# Patient Record
Sex: Male | Born: 1970 | Race: Black or African American | Hispanic: No | Marital: Married | Smoking: Never smoker
Health system: Southern US, Community
[De-identification: ages and names within clinical notes are randomized; demographics above are authoritative.]

## PROBLEM LIST (undated history)

## (undated) DIAGNOSIS — I209 Angina pectoris, unspecified: Secondary | ICD-10-CM

## (undated) DIAGNOSIS — K219 Gastro-esophageal reflux disease without esophagitis: Secondary | ICD-10-CM

## (undated) DIAGNOSIS — Z8739 Personal history of other diseases of the musculoskeletal system and connective tissue: Secondary | ICD-10-CM

## (undated) DIAGNOSIS — I10 Essential (primary) hypertension: Secondary | ICD-10-CM

## (undated) DIAGNOSIS — B359 Dermatophytosis, unspecified: Secondary | ICD-10-CM

## (undated) DIAGNOSIS — M79603 Pain in arm, unspecified: Secondary | ICD-10-CM

## (undated) DIAGNOSIS — J310 Chronic rhinitis: Secondary | ICD-10-CM

## (undated) DIAGNOSIS — M1990 Unspecified osteoarthritis, unspecified site: Secondary | ICD-10-CM

## (undated) HISTORY — PX: VASECTOMY: SHX75

## (undated) HISTORY — PX: WISDOM TOOTH EXTRACTION: SHX21

---

## 2012-06-26 ENCOUNTER — Emergency Department: Payer: Self-pay | Admitting: Emergency Medicine

## 2012-07-23 ENCOUNTER — Emergency Department: Payer: Self-pay | Admitting: Emergency Medicine

## 2013-11-28 ENCOUNTER — Ambulatory Visit: Payer: Self-pay | Admitting: Nurse Practitioner

## 2014-11-14 DIAGNOSIS — I209 Angina pectoris, unspecified: Secondary | ICD-10-CM

## 2014-11-14 HISTORY — DX: Angina pectoris, unspecified: I20.9

## 2016-05-21 ENCOUNTER — Ambulatory Visit
Admission: EM | Admit: 2016-05-21 | Discharge: 2016-05-21 | Disposition: A | Discharge status: Home / Self Care | Payer: BC Managed Care – PPO | Attending: Family Medicine | Admitting: Family Medicine

## 2016-05-21 ENCOUNTER — Ambulatory Visit (INDEPENDENT_AMBULATORY_CARE_PROVIDER_SITE_OTHER): Payer: BC Managed Care – PPO

## 2016-05-21 DIAGNOSIS — R202 Paresthesia of skin: Secondary | ICD-10-CM

## 2016-05-21 NOTE — ED Provider Notes (Signed)
CSN: [id number]     Arrival date & time 05/21/16  0841 History   First MD Initiated Contact with Patient 05/21/16 [id number]     Chief Complaint  Patient presents with  . Numbness   (Consider location/radiation/quality/duration/timing/severity/associated sxs/prior Treatment) HPI  This a 45 year old male who presents with pain and numbness indicating the distal fourth and fifth metatarsal into the toes. He has no known injury. He does have a history of degenerative disc disease in his lumbar spine and has had sciatica on numerous occasions. He has had no discomfort or difficulty with ambulation running or sitting. Does have a noxious feeling when his foot got rubs against bedsheets or other light touching. Had no recent change in shoe wear and has not noticed that shoes bother him. His distal lateral foot is "always numb and tingly" and has the noxious feeling with any light touch. He does not remember any compressive injury to his foot. Not noticed any weakness in the foot.      History reviewed. No pertinent past medical history. Past Surgical History  Procedure Laterality Date  . Vasectomy     Family History  Problem Relation Age of Onset  . Diabetes Mother   . Healthy Father    Social History  Substance Use Topics  . Smoking status: Never Smoker   . Smokeless tobacco: None  . Alcohol Use: 0.0 oz/week    0 Standard drinks or equivalent per week     Comment: occasionally    Review of Systems  Constitutional: Positive for activity change. Negative for fever, chills and fatigue.  Musculoskeletal: Negative for gait problem.  Neurological: Positive for numbness. Negative for weakness.  All other systems reviewed and are negative.   Allergies  Bee venom  Home Medications   Prior to Admission medications   Not on File   Meds Ordered and Administered this Visit  Medications - No data to display  BP 122/76 mmHg  Pulse 78  Temp(Src) 97.3 F (36.3 C) (Tympanic)  Resp 16  Ht  5\' 11"  (1.803 m)  Wt 217 lb (98.431 kg)  BMI 30.28 kg/m2  SpO2 99% No data found.   Physical Exam  Constitutional: He is oriented to person, place, and time. He appears well-developed and well-nourished.  HENT:  Head: Normocephalic and atraumatic.  Eyes: Conjunctivae are normal. Pupils are equal, round, and reactive to light.  Neck: Normal range of motion. Neck supple.  Musculoskeletal: Normal range of motion. He exhibits tenderness. He exhibits no edema.  Examination of the right foot and ankle shows good range of motion dorsiflexion plantar flexion eversion inversion and subtalar motion. There is no edema no ecchymosis no erythema no swelling. He was able esthesia to light touch along the lateral aspect of the foot along the fifth metatarsal that extends proximally to the metatarsal tarsal junction and medially to the fourth metatarsal. The medial aspect of the foot appears to be spared. Anterior tibialis EHL and peroneal muscles are all strong to testing.  Neurological: He is oriented to person, place, and time. He has normal reflexes. He exhibits normal muscle tone. Coordination normal.  Skin: Skin is warm and dry. No erythema.  Psychiatric: He has a normal mood and affect. His behavior is normal. Judgment and thought content normal.  Nursing note and vitals reviewed.   ED Course  Procedures (including critical care time)  Labs Review Labs Reviewed - No data to display  Imaging Review Dg Foot Complete Right  05/21/2016  CLINICAL DATA:  Lateral RIGHT foot numbness at 4th to 5th metatarsals for 2 weeks, no history of trauma or injury, history degenerative disc disease EXAM: RIGHT FOOT COMPLETE - 3+ VIEW COMPARISON:  None FINDINGS: Osseous mineralization normal. Joint spaces preserved. No acute fracture, dislocation or bone destruction. Soft tissues radiographically unremarkable. IMPRESSION: Normal exam. Electronically Signed   By: Ulyses Southward M.D.   On: 05/21/2016 10:19     Visual  Acuity Review  Right Eye Distance:   Left Eye Distance:   Bilateral Distance:    Right Eye Near:   Left Eye Near:    Bilateral Near:         MDM   1. Paresthesia of right foot    There are no discharge medications for this patient. Plan: 1. Test/x-ray results and diagnosis reviewed with patient 2. rx as per orders; risks, benefits, potential side effects reviewed with patient 3. Recommend supportive treatment with Some over-the-counter nonsteroidal anti-inflammatories on still seen by podiatrist. I asked him to be a detective to figure out what may be causing the compression of the nerve. I've explained neuropraxia to the patient advised him that the podiatrist may want to obtain an EMG studies. He will contact the podiatrist this week for a future appointment 4. F/u prn if symptoms worsen or don't improve     Lutricia Feil, PA-C 05/21/16 1035

## 2016-05-21 NOTE — Discharge Instructions (Signed)
Paresthesia Paresthesia is an abnormal burning or prickling sensation. This sensation is generally felt in the hands, arms, legs, or feet. However, it may occur in any part of the body. Usually, it is not painful. The feeling may be described as:  Tingling or numbness.  Pins and needles.  Skin crawling.  Buzzing.  Limbs falling asleep.  Itching. Most people experience temporary (transient) paresthesia at some time in their lives. Paresthesia may occur when you breathe too quickly (hyperventilation). It can also occur without any apparent cause. Commonly, paresthesia occurs when pressure is placed on a nerve. The sensation quickly goes away after the pressure is removed. For some people, however, paresthesia is a long-lasting (chronic) condition that is caused by an underlying disorder. If you continue to have paresthesia, you may need further medical evaluation. HOME CARE INSTRUCTIONS Watch your condition for any changes. Taking the following actions may help to lessen any discomfort that you are feeling:  Avoid drinking alcohol.  Try acupuncture or massage to help relieve your symptoms.  Keep all follow-up visits as directed by your health care provider. This is important. SEEK MEDICAL CARE IF:  You continue to have episodes of paresthesia.  Your burning or prickling feeling gets worse when you walk.  You have pain, cramps, or dizziness.  You develop a rash. SEEK IMMEDIATE MEDICAL CARE IF:  You feel weak.  You have trouble walking or moving.  You have problems with speech, understanding, or vision.  You feel confused.  You cannot control your bladder or bowel movements.  You have numbness after an injury.  You faint.   This information is not intended to replace advice given to you by your health care provider. Make sure you discuss any questions you have with your health care provider.   Document Released: 10/21/2002 Document Revised: 03/17/2015 Document Reviewed:  10/27/2014 Elsevier Interactive Patient Education 2016 [employer]  

## 2016-05-21 NOTE — ED Notes (Signed)
Patient complains of right foot pain and numbness. Patient states that he has no known injury to the foot. Patient states that pain occurs when side of foot rubs up against something. Patient denies pain with ambulating, running, or sitting still.

## 2016-08-21 ENCOUNTER — Ambulatory Visit (INDEPENDENT_AMBULATORY_CARE_PROVIDER_SITE_OTHER): Payer: BC Managed Care – PPO

## 2016-08-21 ENCOUNTER — Ambulatory Visit
Admission: EM | Admit: 2016-08-21 | Discharge: 2016-08-21 | Disposition: A | Discharge status: Home / Self Care | Payer: BC Managed Care – PPO | Attending: Family Medicine | Admitting: Family Medicine

## 2016-08-21 ENCOUNTER — Encounter: Payer: Self-pay | Admitting: Emergency Medicine

## 2016-08-21 DIAGNOSIS — J209 Acute bronchitis, unspecified: Secondary | ICD-10-CM

## 2016-08-21 DIAGNOSIS — J069 Acute upper respiratory infection, unspecified: Secondary | ICD-10-CM | POA: Diagnosis not present

## 2016-08-21 MED ORDER — AZITHROMYCIN 500 MG PO TABS: ORAL_TABLET | ORAL | 0 refills | Status: DC

## 2016-08-21 MED ORDER — HYDROCOD POLST-CPM POLST ER 10-8 MG/5ML PO SUER: 5.0000 mL | Freq: Two times a day (BID) | ORAL | 0 refills | Status: DC | PRN

## 2016-08-21 MED ORDER — FEXOFENADINE-PSEUDOEPHED ER 180-240 MG PO TB24: 1.0000 | ORAL_TABLET | Freq: Every day | ORAL | 0 refills | Status: DC

## 2016-08-21 NOTE — ED Provider Notes (Signed)
[facility]    CSN: [id number] Arrival date & time: 08/21/16  1149     History   Chief Complaint Chief Complaint  Patient presents with  . Cough    HPI Kyle Fields is a 45 y.o. male.   She said because of cough and congestion for about 3 weeks. States it started off as continue to drain Dynabac for stroke. Now he states his coughing. States his coughing all the time is greenish slimy material but he is producing. He denies any bronchospasm but states that he's up through the night unable to sleep because of the coughing. States he still has some sinus pressure. He denies any medical problems no known drug allergies and does not smoke. No pertinent past family medical history pertaining to this visit. The only surgery he's had is a vasectomy in the past   The history is provided by the patient. No language interpreter was used.  Cough  Cough characteristics:  Productive, croupy and paroxysmal Sputum characteristics:  Green Severity:  Moderate Onset quality:  Sudden Timing:  Constant Progression:  Worsening Chronicity:  New Smoker: no   Context: upper respiratory infection and with activity   Relieved by:  Nothing Worsened by:  Deep breathing Ineffective treatments:  None tried Associated symptoms: no shortness of breath     History reviewed. No pertinent past medical history.  There are no active problems to display for this patient.   Past Surgical History:  Procedure Laterality Date  . VASECTOMY         Home Medications    Prior to Admission medications   Medication Sig Start Date End Date Taking? Authorizing Provider  azithromycin (ZITHROMAX) 500 MG tablet 1 tablet daily for 5 days 08/21/16   Hassan Rowan, MD  chlorpheniramine-HYDROcodone [facility]) 10-8 MG/5ML SUER Take 5 mLs by mouth every 12 (twelve) hours as needed for cough. 08/21/16   Hassan Rowan, MD  fexofenadine-pseudoephedrine (ALLEGRA-D ALLERGY & CONGESTION) 180-240  MG 24 hr tablet Take 1 tablet by mouth daily. 08/21/16   Hassan Rowan, MD    Family History Family History  Problem Relation Age of Onset  . Diabetes Mother   . Healthy Father     Social History Social History  Substance Use Topics  . Smoking status: Never Smoker  . Smokeless tobacco: Never Used  . Alcohol use 0.0 oz/week     Comment: occasionally     Allergies   Bee venom   Review of Systems Review of Systems  HENT: Positive for sinus pressure.   Respiratory: Positive for cough. Negative for shortness of breath.   All other systems reviewed and are negative.    Physical Exam Triage Vital Signs ED Triage Vitals  Enc Vitals Group     BP 08/21/16 1202 133/87     Pulse Rate 08/21/16 1202 (!) 108     Resp 08/21/16 1202 16     Temp 08/21/16 1202 98.4 F (36.9 C)     Temp Source 08/21/16 1202 Tympanic     SpO2 08/21/16 1202 98 %     Weight 08/21/16 1201 220 lb (99.8 kg)     Height 08/21/16 1201 5\' 11"  (1.803 m)     Head Circumference --      Peak Flow --      Pain Score 08/21/16 1201 2     Pain Loc --      Pain Edu? --      Excl. in GC? --  No data found.   Updated Vital Signs BP 133/87 (BP Location: Left Arm)   Pulse (!) 108   Temp 98.4 F (36.9 C) (Tympanic)   Resp 16   Ht 5\' 11"  (1.803 m)   Wt 220 lb (99.8 kg)   SpO2 98%   BMI 30.68 kg/m   Visual Acuity Right Eye Distance:   Left Eye Distance:   Bilateral Distance:    Right Eye Near:   Left Eye Near:    Bilateral Near:     Physical Exam  Constitutional: He is oriented to person, place, and time. He appears well-developed and well-nourished.  HENT:  Head: Normocephalic and atraumatic.  Right Ear: External ear normal.  Left Ear: External ear normal.  Nose: Nose normal.  Mouth/Throat: Oropharynx is clear and moist.  Eyes: Pupils are equal, round, and reactive to light.  Neck: Normal range of motion.  Cardiovascular: Normal rate and regular rhythm.   Pulmonary/Chest: Effort normal and  breath sounds normal.  Musculoskeletal: Normal range of motion.  Neurological: He is alert and oriented to person, place, and time.  Skin: Skin is warm and dry.  Psychiatric: He has a normal mood and affect.  Vitals reviewed.    UC Treatments / Results  Labs (all labs ordered are listed, but only abnormal results are displayed) Labs Reviewed - No data to display  EKG  EKG Interpretation None       Radiology Dg Chest 2 View  Result Date: 08/21/2016 CLINICAL DATA:  Pt with cough x three weeks. No hx of trauma injury or illness. EXAM: CHEST  2 VIEW COMPARISON:  None. FINDINGS: The heart size and mediastinal contours are within normal limits. Both lungs are clear. No pleural effusion or pneumothorax. The visualized skeletal structures are unremarkable. IMPRESSION: No active cardiopulmonary disease. Electronically Signed   By: Amie Portlandavid  Ormond M.D.   On: 08/21/2016 12:43    Procedures Procedures (including critical care time)  Medications Ordered in UC Medications - No data to display   Initial Impression / Assessment and Plan / UC Course  I have reviewed the triage vital signs and the nursing notes.  Pertinent labs & imaging results that were available during my care of the patient were reviewed by me and considered in my medical decision making (see chart for details).  Clinical Course   Patient chest x-rays negative will place him on Zithromax 500 mg 1 tablet daily for the next 5 days test next 1 teaspoon twice a day and Allegra-D daily. Will give a work note for tomorrow and have him follow-up with his PCP if not better in a week.   Final diagnoses:  Acute bronchitis, unspecified organism  Upper respiratory tract infection, unspecified type    New Prescriptions New Prescriptions   AZITHROMYCIN (ZITHROMAX) 500 MG TABLET    1 tablet daily for 5 days   CHLORPHENIRAMINE-HYDROCODONE (TUSSIONEX PENNKINETIC ER) 10-8 MG/5ML SUER    Take 5 mLs by mouth every 12 (twelve) hours  as needed for cough.   FEXOFENADINE-PSEUDOEPHEDRINE (ALLEGRA-D ALLERGY & CONGESTION) 180-240 MG 24 HR TABLET    Take 1 tablet by mouth daily.     Hassan RowanEugene Terah Robey, MD 08/21/16 [phone]

## 2016-08-21 NOTE — ED Triage Notes (Signed)
Patient c/o cough and chest congestion for 3 weeks.   

## 2017-02-14 ENCOUNTER — Ambulatory Visit
Admission: EM | Admit: 2017-02-14 | Discharge: 2017-02-14 | Disposition: A | Discharge status: Home / Self Care | Payer: BC Managed Care – PPO | Attending: Family Medicine | Admitting: Family Medicine

## 2017-02-14 DIAGNOSIS — S39012A Strain of muscle, fascia and tendon of lower back, initial encounter: Secondary | ICD-10-CM | POA: Diagnosis not present

## 2017-02-14 HISTORY — DX: Personal history of other diseases of the musculoskeletal system and connective tissue: Z87.39

## 2017-02-14 MED ORDER — PREDNISONE 20 MG PO TABS: ORAL_TABLET | ORAL | 0 refills | Status: DC

## 2017-02-14 NOTE — ED Provider Notes (Signed)
[facility]    CSN: [id number] Arrival date & time: 02/14/17  1214     History   Chief Complaint Chief Complaint  Patient presents with  . Back Pain    HPI Kyle Fields is a 46 y.o. male.    Back Pain  Location:  Lumbar spine Pain severity:  Moderate Duration:  3 days Timing:  Constant Chronicity:  Recurrent Context: physical stress (since doing yard work) and twisting   Context: not emotional stress, not falling, not jumping from heights, not lifting heavy objects, not MCA, not MVA, not occupational injury, not pedestrian accident, not recent illness and not recent injury   Relieved by:  Nothing Worsened by:  Ambulation Ineffective treatments:  Muscle relaxants Associated symptoms: no abdominal pain, no abdominal swelling, no bladder incontinence, no bowel incontinence, no chest pain, no dysuria, no fever, no headaches, no leg pain, no numbness, no paresthesias, no pelvic pain, no perianal numbness, no tingling, no weakness and no weight loss   Risk factors: no hx of cancer, no hx of osteoporosis, no lack of exercise, no menopause, not obese, not pregnant, no recent surgery, no steroid use and no vascular disease     Past Medical History:  Diagnosis Date  . H/O degenerative disc disease     There are no active problems to display for this patient.   Past Surgical History:  Procedure Laterality Date  . VASECTOMY         Home Medications    Prior to Admission medications   Medication Sig Start Date End Date Taking? Authorizing Provider  predniSONE (DELTASONE) 20 MG tablet 2 tabs po qd for 5 days 02/14/17   Payton Mccallum, MD    Family History Family History  Problem Relation Age of Onset  . Diabetes Mother   . Healthy Father     Social History Social History  Substance Use Topics  . Smoking status: Never Smoker  . Smokeless tobacco: Never Used  . Alcohol use 0.0 oz/week     Comment: occasionally     Allergies   Bee  venom   Review of Systems Review of Systems  Constitutional: Negative for fever and weight loss.  Cardiovascular: Negative for chest pain.  Gastrointestinal: Negative for abdominal pain and bowel incontinence.  Genitourinary: Negative for bladder incontinence, dysuria and pelvic pain.  Musculoskeletal: Positive for back pain.  Neurological: Negative for tingling, weakness, numbness, headaches and paresthesias.     Physical Exam Triage Vital Signs ED Triage Vitals [02/14/17 1242]  Enc Vitals Group     BP (!) 153/92     Pulse Rate 63     Resp 18     Temp 98.2 F (36.8 C)     Temp Source Oral     SpO2 100 %     Weight 220 lb (99.8 kg)     Height  (1.803 m)     Head Circumference      Peak Flow      Pain Score 8     Pain Loc      Pain Edu?      Excl. in GC?    No data found.   Updated Vital Signs BP (!) 153/92 (BP Location: Left Arm)   Pulse [id number] 98.2 F (36.8 C) (Oral)   Resp 18   Ht  (1.803 m)   Wt 220 lb (99.8 kg)   SpO2 100%   BMI 30.68 kg/m   Visual Acuity Right Eye  Distance:   Left Eye Distance:   Bilateral Distance:    Right Eye Near:   Left Eye Near:    Bilateral Near:     Physical Exam  Constitutional: He appears well-developed and well-nourished. No distress.  Neck: Normal range of motion. Neck supple. No tracheal deviation present.  Pulmonary/Chest: Effort normal. No stridor. No respiratory distress.  Musculoskeletal:       Cervical back: He exhibits normal range of motion, no tenderness, no bony tenderness, no swelling, no edema, no deformity, no laceration, no pain, no spasm and normal pulse.       Lumbar back: He exhibits tenderness and spasm. He exhibits normal range of motion, no bony tenderness, no swelling, no edema, no deformity, no laceration, no pain and normal pulse.  Neurological: He is alert. He has normal reflexes. He displays normal reflexes. He exhibits normal muscle tone. Coordination normal.  Skin: No rash  noted. He is not diaphoretic.  Nursing note and vitals reviewed.    UC Treatments / Results  Labs (all labs ordered are listed, but only abnormal results are displayed) Labs Reviewed - No data to display  EKG  EKG Interpretation None       Radiology No results found.  Procedures Procedures (including critical care time)  Medications Ordered in UC Medications - No data to display   Initial Impression / Assessment and Plan / UC Course  I have reviewed the triage vital signs and the nursing notes.  Pertinent labs & imaging results that were available during my care of the patient were reviewed by me and considered in my medical decision making (see chart for details).       Final Clinical Impressions(s) / UC Diagnoses   Final diagnoses:  Strain of lumbar region, initial encounter    New Prescriptions Discharge Medication List as of 02/14/2017  1:12 PM    START taking these medications   Details  predniSONE (DELTASONE) 20 MG tablet 2 tabs po qd for 5 days, Normal       1. diagnosis reviewed with patient 2. rx as per orders above; reviewed possible side effects, interactions, risks and benefits  3. Recommend supportive treatment with heat, stretch 4. Follow-up prn if symptoms worsen or don't improve   Payton Mccallum, MD 02/14/17 1338

## 2017-02-14 NOTE — ED Triage Notes (Addendum)
Pt c/o lower back pain that radiates to his groin area. First thing in the morning he is ok but throughout the day it gets worse. He has degenerative disease.  He mentions working in the yard a lot lately.

## 2017-10-20 ENCOUNTER — Other Ambulatory Visit: Payer: Self-pay | Admitting: Nurse Practitioner

## 2017-10-20 DIAGNOSIS — G8929 Other chronic pain: Secondary | ICD-10-CM

## 2017-10-20 DIAGNOSIS — M4722 Other spondylosis with radiculopathy, cervical region: Secondary | ICD-10-CM

## 2017-10-20 DIAGNOSIS — M542 Cervicalgia: Secondary | ICD-10-CM

## 2017-10-20 DIAGNOSIS — R29898 Other symptoms and signs involving the musculoskeletal system: Secondary | ICD-10-CM

## 2017-10-31 ENCOUNTER — Ambulatory Visit
Admission: RE | Admit: 2017-10-31 | Discharge: 2017-10-31 | Disposition: A | Discharge status: Home / Self Care | Payer: BC Managed Care – PPO | Source: Ambulatory Visit | Attending: Nurse Practitioner | Admitting: Nurse Practitioner

## 2017-10-31 DIAGNOSIS — M4802 Spinal stenosis, cervical region: Secondary | ICD-10-CM | POA: Insufficient documentation

## 2017-10-31 DIAGNOSIS — G8929 Other chronic pain: Secondary | ICD-10-CM | POA: Insufficient documentation

## 2017-10-31 DIAGNOSIS — M4722 Other spondylosis with radiculopathy, cervical region: Secondary | ICD-10-CM | POA: Diagnosis present

## 2017-10-31 DIAGNOSIS — M5011 Cervical disc disorder with radiculopathy,  high cervical region: Secondary | ICD-10-CM | POA: Insufficient documentation

## 2017-10-31 DIAGNOSIS — R29898 Other symptoms and signs involving the musculoskeletal system: Secondary | ICD-10-CM | POA: Diagnosis not present

## 2017-10-31 DIAGNOSIS — M542 Cervicalgia: Secondary | ICD-10-CM

## 2017-11-30 ENCOUNTER — Encounter: Payer: Self-pay | Admitting: *Deleted

## 2017-12-01 ENCOUNTER — Ambulatory Visit
Admission: RE | Admit: 2017-12-01 | Discharge: 2017-12-01 | Disposition: A | Discharge status: Home / Self Care | Payer: BC Managed Care – PPO | Source: Ambulatory Visit | Attending: Gastroenterology | Admitting: Gastroenterology

## 2017-12-01 ENCOUNTER — Encounter: Payer: Self-pay | Admitting: *Deleted

## 2017-12-01 ENCOUNTER — Ambulatory Visit: Payer: BC Managed Care – PPO | Admitting: Anesthesiology

## 2017-12-01 ENCOUNTER — Encounter
Admission: RE | Disposition: A | Discharge status: Home / Self Care | Payer: Self-pay | Source: Ambulatory Visit | Attending: Gastroenterology

## 2017-12-01 DIAGNOSIS — M1990 Unspecified osteoarthritis, unspecified site: Secondary | ICD-10-CM | POA: Insufficient documentation

## 2017-12-01 DIAGNOSIS — K2970 Gastritis, unspecified, without bleeding: Secondary | ICD-10-CM | POA: Diagnosis not present

## 2017-12-01 DIAGNOSIS — K2950 Unspecified chronic gastritis without bleeding: Secondary | ICD-10-CM | POA: Insufficient documentation

## 2017-12-01 DIAGNOSIS — G8929 Other chronic pain: Secondary | ICD-10-CM | POA: Insufficient documentation

## 2017-12-01 DIAGNOSIS — Z79899 Other long term (current) drug therapy: Secondary | ICD-10-CM | POA: Diagnosis not present

## 2017-12-01 DIAGNOSIS — K228 Other specified diseases of esophagus: Secondary | ICD-10-CM | POA: Insufficient documentation

## 2017-12-01 DIAGNOSIS — K219 Gastro-esophageal reflux disease without esophagitis: Secondary | ICD-10-CM | POA: Insufficient documentation

## 2017-12-01 HISTORY — DX: Gastro-esophageal reflux disease without esophagitis: K21.9

## 2017-12-01 HISTORY — DX: Dermatophytosis, unspecified: B35.9

## 2017-12-01 HISTORY — DX: Chronic rhinitis: J31.0

## 2017-12-01 HISTORY — DX: Unspecified osteoarthritis, unspecified site: M19.90

## 2017-12-01 HISTORY — DX: Angina pectoris, unspecified: I20.9

## 2017-12-01 HISTORY — PX: ESOPHAGOGASTRODUODENOSCOPY (EGD) WITH PROPOFOL: SHX5813

## 2017-12-01 SURGERY — ESOPHAGOGASTRODUODENOSCOPY (EGD) WITH PROPOFOL
Anesthesia: General

## 2017-12-01 MED ORDER — SODIUM CHLORIDE 0.9 % IV SOLN
INTRAVENOUS | Status: DC
  Administered 2017-12-01: 14:00:00 via INTRAVENOUS

## 2017-12-01 MED ORDER — LIDOCAINE HCL (CARDIAC) 20 MG/ML IV SOLN
INTRAVENOUS | Status: DC | PRN
  Administered 2017-12-01: 50 mg via INTRAVENOUS

## 2017-12-01 MED ORDER — PROPOFOL 10 MG/ML IV BOLUS
INTRAVENOUS | Status: DC | PRN
  Administered 2017-12-01: 200 mg via INTRAVENOUS

## 2017-12-01 MED ORDER — GLYCOPYRROLATE 0.2 MG/ML IJ SOLN
INTRAMUSCULAR | Status: DC | PRN
  Administered 2017-12-01: 0.2 mg via INTRAVENOUS

## 2017-12-01 MED ORDER — SODIUM CHLORIDE 0.9 % IV SOLN: INTRAVENOUS | Status: DC

## 2017-12-01 MED ORDER — PROPOFOL 500 MG/50ML IV EMUL
INTRAVENOUS | Status: AC
Start: 2017-12-01 — End: 2017-12-01
  Filled 2017-12-01: qty 50

## 2017-12-01 MED ORDER — PROPOFOL 500 MG/50ML IV EMUL
INTRAVENOUS | Status: DC | PRN
  Administered 2017-12-01: 160 ug/kg/min via INTRAVENOUS

## 2017-12-01 MED ORDER — LIDOCAINE HCL (PF) 2 % IJ SOLN
INTRAMUSCULAR | Status: AC
  Filled 2017-12-01: qty 10

## 2017-12-01 NOTE — H&P (Signed)
Outpatient short stay form Pre-procedure 12/01/2017 2:44 PM Christena DeemMartin U Skulskie MD  Primary Physician: Lenon OmsSarah Gauger NP  Reason for visit:  EGD  History of present illness:  Patient is a 47 year old male presenting today as above. He has a history of some left upper quadrant discomfort occurring over the past several months. There's been some dyspepsia as well. He was recently placed on a proton pump inhibitor and has improved his symptoms. He does however continue to take naproxen 4 chronic back pain. He takes no other aspirin products or blood thinning agents.    Current Facility-Administered Medications:  .  0.9 %  sodium chloride infusion, , Intravenous, Continuous, Christena DeemSkulskie, Martin U, MD, Last Rate: 20 mL/hr at 12/01/17 1420 .  0.9 %  sodium chloride infusion, , Intravenous, Continuous, Christena DeemSkulskie, Martin U, MD  Medications Prior to Admission  Medication Sig Dispense Refill Last Dose  . cyclobenzaprine (FLEXERIL) 5 MG tablet Take 5 mg by mouth 3 (three) times daily as needed for muscle spasms.   Past Month at Unknown time  . EPINEPHrine 0.3 mg/0.3 mL IJ SOAJ injection Inject into the muscle once.     Marland Kitchen. ketoconazole (NIZORAL) 200 MG tablet Take by mouth daily.   Past Month at Unknown time  . naproxen (NAPROSYN) 500 MG tablet Take 500 mg by mouth 2 (two) times daily with a meal.   Past Month at Unknown time  . pantoprazole (PROTONIX) 40 MG tablet Take 40 mg by mouth daily.   11/30/2017 at Unknown time  . selenium sulfide (SELSUN) 2.5 % shampoo Apply 1 application topically daily as needed for irritation.   Past Week at Unknown time  . predniSONE (DELTASONE) 20 MG tablet 2 tabs po qd for 5 days (Patient not taking: Reported on 12/01/2017) 10 tablet 0 Completed Course at Unknown time     Allergies  Allergen Reactions  . Bee Venom      Past Medical History:  Diagnosis Date  . Anginal pain (HCC)   . DJD (degenerative joint disease)   . GERD (gastroesophageal reflux disease)   . H/O  degenerative disc disease   . Rhinitis   . Tinea     Review of systems:      Physical Exam    Heart and lungs: Regular rate and rhythm without rub or gallop, lungs are bilaterally clear.    HEENT: Normocephalic atraumatic eyes are anicteric    Other:     Pertinant exam for procedure: Soft nontender nondistended bowel sounds positive normoactive.    Planned proceedures: EGD and indicated procedures. I have discussed the risks benefits and complications of procedures to include not limited to bleeding, infection, perforation and the risk of sedation and the patient wishes to proceed.    Christena DeemMartin U Skulskie, MD Gastroenterology 12/01/2017  2:44 PM

## 2017-12-01 NOTE — Anesthesia Post-op Follow-up Note (Signed)
Anesthesia QCDR form completed.        

## 2017-12-01 NOTE — Anesthesia Postprocedure Evaluation (Signed)
Anesthesia Post Note  Patient: Kyle HoppingChester Levett  Procedure(s) Performed: ESOPHAGOGASTRODUODENOSCOPY (EGD) WITH PROPOFOL (N/A )  Patient location during evaluation: Endoscopy Anesthesia Type: General Level of consciousness: awake and alert Pain management: pain level controlled Vital Signs Assessment: post-procedure vital signs reviewed and stable Respiratory status: spontaneous breathing and respiratory function stable Cardiovascular status: stable Anesthetic complications: no     Last Vitals:  Vitals:   12/01/17 1530 12/01/17 1543  BP: 114/88 (!) 120/91  Pulse: 82 68  Resp: 12 13  Temp:    SpO2: 100% 100%    Last Pain:  Vitals:   12/01/17 1405  TempSrc: Tympanic                 KEPHART,WILLIAM K

## 2017-12-01 NOTE — Anesthesia Preprocedure Evaluation (Signed)
Anesthesia Evaluation  Patient identified by MRN, date of birth, ID band Patient awake    Reviewed: Allergy & Precautions, NPO status , Patient's Chart, lab work & pertinent test results, reviewed documented beta blocker date and time   Airway Mallampati: III  TM Distance: >3 FB     Dental  (+) Chipped   Pulmonary           Cardiovascular + angina      Neuro/Psych    GI/Hepatic GERD  ,  Endo/Other    Renal/GU      Musculoskeletal  (+) Arthritis ,   Abdominal   Peds  Hematology   Anesthesia Other Findings   Reproductive/Obstetrics                             Anesthesia Physical Anesthesia Plan  ASA: II  Anesthesia Plan: General   Post-op Pain Management:    Induction: Intravenous  PONV Risk Score and Plan:   Airway Management Planned:   Additional Equipment:   Intra-op Plan:   Post-operative Plan:   Informed Consent: I have reviewed the patients History and Physical, chart, labs and discussed the procedure including the risks, benefits and alternatives for the proposed anesthesia with the patient or authorized representative who has indicated his/her understanding and acceptance.     Plan Discussed with: CRNA  Anesthesia Plan Comments:         Anesthesia Quick Evaluation

## 2017-12-01 NOTE — Transfer of Care (Signed)
Immediate Anesthesia Transfer of Care Note  Patient: Kyle HoppingChester Mcclune  Procedure(s) Performed: ESOPHAGOGASTRODUODENOSCOPY (EGD) WITH PROPOFOL (N/A )  Patient Location: PACU  Anesthesia Type:General  Level of Consciousness: sedated  Airway & Oxygen Therapy: Patient Spontanous Breathing and Patient connected to nasal cannula oxygen  Post-op Assessment: Report given to RN and Post -op Vital signs reviewed and stable  Post vital signs: Reviewed and stable  Last Vitals:  Vitals:   12/01/17 1405 12/01/17 1510  BP: (!) 128/92 98/67  Pulse: 67 (!) 103  Resp: 20 (!) 27  Temp: 37.1 C   SpO2: 99% 97%    Last Pain:  Vitals:   12/01/17 1405  TempSrc: Tympanic         Complications: No apparent anesthesia complications

## 2017-12-01 NOTE — Op Note (Signed)
[facility] Gastroenterology Patient Name: Kyle Fields Procedure Date: 12/01/2017 2:37 PM MRN: [id number] Account #: [id number] Date of Birth: [birth year] Admit Type: Outpatient Age: 47 Room: [facility] ENDO ROOM 1 Gender: Male Note Status: Finalized Procedure:            Upper GI endoscopy Indications:          Abdominal pain in the left upper quadrant, Dyspepsia Providers:            Christena Deem, MD Referring MD:         Caryl Asp (Referring MD) Medicines:            Monitored Anesthesia Care Complications:        No immediate complications. Procedure:            Pre-Anesthesia Assessment:                       - ASA Grade Assessment: II - A patient with mild                        systemic disease.                       After obtaining informed consent, the endoscope was                        passed under direct vision. Throughout the procedure,                        the patient's blood pressure, pulse, and oxygen                        saturations were monitored continuously. The Endoscope                        was introduced through the mouth, and advanced to the                        third part of duodenum. The patient tolerated the                        procedure well. Findings:      The Z-line was variable. Biopsies were taken with a cold forceps for       histology.      Diffuse and patchy moderate inflammation characterized by congestion       (edema) and erythema was found in the upper gastric body. Biopsies were       taken with a cold forceps for histology. Biopsies were taken with a cold       forceps for Helicobacter pylori testing.      Diffuse minimal inflammation characterized by erythema was found in the       gastric antrum. Biopsies were taken with a cold forceps for histology.       Biopsies were taken with a cold forceps for Helicobacter pylori testing.      The cardia and gastric fundus were normal on retroflexion  otherwise.      The examined duodenum was normal. Impression:           - Z-line variable. Biopsied.                       -  Gastritis. Biopsied.                       - Gastritis. Biopsied.                       - Normal examined duodenum. Recommendation:       - Continue present medications.                       - Discharge patient to home.                       - Return to GI clinic in 1 month. Procedure Code(s):    --- Professional ---                       [phone], Esophagogastroduodenoscopy, flexible, transoral;                        with biopsy, single or multiple Diagnosis Code(s):    --- Professional ---                       K22.8, Other specified diseases of esophagus                       K29.70, Gastritis, unspecified, without bleeding                       R10.12, Left upper quadrant pain                       R10.13, Epigastric pain CPT copyright 2016 [employer]. All rights reserved. The codes documented in this report are preliminary and upon coder review may  be revised to meet current compliance requirements. Christena DeemMartin U Skulskie, MD 12/01/2017 3:09:19 PM This report has been signed electronically. Number of Addenda: 0 Note Initiated On: 12/01/2017 2:37 PM      [facility]

## 2017-12-02 ENCOUNTER — Encounter: Payer: Self-pay | Admitting: Gastroenterology

## 2017-12-05 LAB — SURGICAL PATHOLOGY

## 2018-02-01 ENCOUNTER — Other Ambulatory Visit: Payer: Self-pay | Admitting: Gastroenterology

## 2018-02-01 DIAGNOSIS — R1032 Left lower quadrant pain: Secondary | ICD-10-CM

## 2018-02-01 DIAGNOSIS — R1012 Left upper quadrant pain: Secondary | ICD-10-CM

## 2018-02-07 ENCOUNTER — Ambulatory Visit
Admission: RE | Admit: 2018-02-07 | Discharge: 2018-02-07 | Disposition: A | Discharge status: Home / Self Care | Payer: BC Managed Care – PPO | Source: Ambulatory Visit | Attending: Gastroenterology | Admitting: Gastroenterology

## 2018-02-07 DIAGNOSIS — R1032 Left lower quadrant pain: Secondary | ICD-10-CM | POA: Diagnosis present

## 2018-02-07 DIAGNOSIS — R1012 Left upper quadrant pain: Secondary | ICD-10-CM | POA: Insufficient documentation

## 2018-02-07 MED ORDER — IOPAMIDOL (ISOVUE-300) INJECTION 61%
50.0000 mL | Freq: Once | INTRAVENOUS | Status: AC | PRN
  Administered 2018-02-07: 100 mL via INTRAVENOUS

## 2018-10-03 ENCOUNTER — Encounter: Payer: Self-pay | Admitting: Student in an Organized Health Care Education/Training Program

## 2018-10-03 ENCOUNTER — Ambulatory Visit
Payer: BC Managed Care – PPO | Attending: Student in an Organized Health Care Education/Training Program | Admitting: Student in an Organized Health Care Education/Training Program

## 2018-10-03 ENCOUNTER — Other Ambulatory Visit: Payer: Self-pay

## 2018-10-03 VITALS — BP 127/96 | HR 88 | Temp 97.9°F | Resp 18 | Ht 71.0 in | Wt 215.0 lb

## 2018-10-03 DIAGNOSIS — M542 Cervicalgia: Secondary | ICD-10-CM

## 2018-10-03 DIAGNOSIS — K219 Gastro-esophageal reflux disease without esophagitis: Secondary | ICD-10-CM | POA: Diagnosis not present

## 2018-10-03 DIAGNOSIS — G894 Chronic pain syndrome: Secondary | ICD-10-CM | POA: Diagnosis present

## 2018-10-03 DIAGNOSIS — M5412 Radiculopathy, cervical region: Secondary | ICD-10-CM | POA: Diagnosis not present

## 2018-10-03 DIAGNOSIS — M5030 Other cervical disc degeneration, unspecified cervical region: Secondary | ICD-10-CM | POA: Diagnosis not present

## 2018-10-03 NOTE — Progress Notes (Signed)
Safety precautions to be maintained throughout the outpatient stay will include: orient to surroundings, keep bed in low position, maintain call bell within reach at all times, provide assistance with transfer out of bed and ambulation.  

## 2018-10-03 NOTE — Patient Instructions (Addendum)
ASAP for L Cervical ESI  Epidural Steroid Injection Patient Information  Description: The epidural space surrounds the nerves as they exit the spinal cord.  In some patients, the nerves can be compressed and inflamed by a bulging disc or a tight spinal canal (spinal stenosis).  By injecting steroids into the epidural space, we can bring irritated nerves into direct contact with a potentially helpful medication.  These steroids act directly on the irritated nerves and can reduce swelling and inflammation which often leads to decreased pain.  Epidural steroids may be injected anywhere along the spine and from the neck to the low back depending upon the location of your pain.   After numbing the skin with local anesthetic (like Novocaine), a small needle is passed into the epidural space slowly.  You may experience a sensation of pressure while this is being done.  The entire block usually last less than 10 minutes.  Conditions which may be treated by epidural steroids:   Low back and leg pain  Neck and arm pain  Spinal stenosis  Post-laminectomy syndrome  Herpes zoster (shingles) pain  Pain from compression fractures  Preparation for the injection:  1. Do not eat any solid food or dairy products within 8 hours of your appointment.  2. You may drink clear liquids up to 3 hours before appointment.  Clear liquids include water, black coffee, juice or soda.  No milk or cream please. 3. You may take your regular medication, including pain medications, with a sip of water before your appointment  Diabetics should hold regular insulin (if taken separately) and take 1/2 normal NPH dos the morning of the procedure.  Carry some sugar containing items with you to your appointment. 4. A driver must accompany you and be prepared to drive you home after your procedure.  5. Bring all your current medications with your. 6. An IV may be inserted and sedation may be given at the discretion of the  physician.   7. A blood pressure cuff, EKG and other monitors will often be applied during the procedure.  Some patients may need to have extra oxygen administered for a short period. 8. You will be asked to provide medical information, including your allergies, prior to the procedure.  We must know immediately if you are taking blood thinners (like Coumadin/Warfarin)  Or if you are allergic to IV iodine contrast (dye). We must know if you could possible be pregnant.  Possible side-effects:  Bleeding from needle site  Infection (rare, may require surgery)  Nerve injury (rare)  Numbness & tingling (temporary)  Difficulty urinating (rare, temporary)  Spinal headache ( a headache worse with upright posture)  Light -headedness (temporary)  Pain at injection site (several days)  Decreased blood pressure (temporary)  Weakness in arm/leg (temporary)  Pressure sensation in back/neck (temporary)  Call if you experience:  Fever/chills associated with headache or increased back/neck pain.  Headache worsened by an upright position.  New onset weakness or numbness of an extremity below the injection site  Hives or difficulty breathing (go to the emergency room)  Inflammation or drainage at the infection site  Severe back/neck pain  Any new symptoms which are concerning to you  Please note:  Although the local anesthetic injected can often make your back or neck feel good for several hours after the injection, the pain will likely return.  It takes 3-7 days for steroids to work in the epidural space.  You may not notice any pain relief for  at least that one week.  If effective, we will often do a series of three injections spaced 3-6 weeks apart to maximally decrease your pain.  After the initial series, we generally will wait several months before considering a repeat injection of the same type.  If you have any questions, please call [phone] [facility] ____________________________________________________________________________________________  Preparing for Procedure with Sedation  Instructions: . Oral Intake: Do not eat or drink anything for at least 8 hours prior to your procedure. . Transportation: Public transportation is not allowed. Bring an adult driver. The driver must be physically present in our waiting room before any procedure can be started. Marland Kitchen. Physical Assistance: Bring an adult physically capable of assisting you, in the event you need help. This adult should keep you company at home for at least 6 hours after the procedure. . Blood Pressure Medicine: Take your blood pressure medicine with a sip of water the morning of the procedure. . Blood thinners: Notify our staff if you are taking any blood thinners. Depending on which one you take, there will be specific instructions on how and when to stop it. . Diabetics on insulin: Notify the staff so that you can be scheduled 1st case in the morning. If your diabetes requires high dose insulin, take only  of your normal insulin dose the morning of the procedure and notify the staff that you have done so. . Preventing infections: Shower with an antibacterial soap the morning of your procedure. . Build-up your immune system: Take 1000 mg of Vitamin C with every meal (3 times a day) the day prior to your procedure. Marland Kitchen. Antibiotics: Inform the staff if you have a condition or reason that requires you to take antibiotics before dental procedures. . Pregnancy: If you are pregnant, call and cancel the procedure. . Sickness: If you have a cold, fever, or any active infections, call and cancel the procedure. . Arrival: You must be in the facility at least 30 minutes prior to your scheduled procedure. . Children: Do not bring children with you. . Dress appropriately: Bring dark clothing that you would not mind if they get stained. . Valuables: Do not bring any jewelry or  valuables.  Procedure appointments are reserved for interventional treatments only. Marland Kitchen. No Prescription Refills. . No medication changes will be discussed during procedure appointments. . No disability issues will be discussed.  Reasons to call and reschedule or cancel your procedure: (Following these recommendations will minimize the risk of a serious complication.) . Surgeries: Avoid having procedures within 2 weeks of any surgery. (Avoid for 2 weeks before or after any surgery). . Flu Shots: Avoid having procedures within 2 weeks of a flu shots or . (Avoid for 2 weeks before or after immunizations). . Barium: Avoid having a procedure within 7-10 days after having had a radiological study involving the use of radiological contrast. (Myelograms, Barium swallow or enema study). . Heart attacks: Avoid any elective procedures or surgeries for the initial 6 months after a "Myocardial Infarction" (Heart Attack). . Blood thinners: It is imperative that you stop these medications before procedures. Let us know if you if you take any blood thinner.  . Infection: Avoid procedures during or within two weeks of an infection (including chest colds or gastrointestinal problems). Symptoms associated with infections include: Localized redness, fever, chills, night sweats or profuse sweating, burning sensation when voiding, cough, congestion, stuffiness, runny nose, sore throat, diarrhea, nausea, vomiting, cold or Flu symptoms, recent  or current infections. It is specially important if the infection is over the area that we intend to treat. Marland Kitchen Heart and lung problems: Symptoms that may suggest an active cardiopulmonary problem include: cough, chest pain, breathing difficulties or shortness of breath, dizziness, ankle swelling, uncontrolled high or unusually low blood pressure, and/or palpitations. If you are experiencing any of these symptoms, cancel your procedure and contact your primary care physician for an  evaluation.  Remember:  Regular Business hours are:  Monday to Thursday 8:00 AM to 4:00 PM  Provider's Schedule: Delano Metz, MD:  Procedure days: Tuesday and Thursday 7:30 AM to 4:00 PM  Edward Jolly, MD:  Procedure days: Monday and Wednesday 7:30 AM to 4:00 PM ____________________________________________________________________________________________

## 2018-10-03 NOTE — Progress Notes (Signed)
Without any patient's Name: Vasil Juhasz  MRN: [id number]  Referring Provider: Sallee Lange, *  DOB: [birth year]  PCP: Sallee Lange, NP  DOS: 10/03/2018  Note by: Gillis Santa, MD  Service setting: Ambulatory outpatient  Specialty: Interventional Pain Management  Location: [facility] (AMB) Pain Management Facility  Visit type: Initial Patient Evaluation  Patient type: New Patient   Primary Reason(s) for Visit: Encounter for initial evaluation of one or more chronic problems (new to examiner) potentially causing chronic pain, and posing a threat to normal musculoskeletal function. (Level of risk: High) CC: No chief complaint on file.  HPI  Mr. Wesch is a 47 y.o. year old, male patient, who comes today to see [place] for the first time for an initial evaluation of his chronic pain. He has Cervical radiculopathy (LEFT C6,7); DDD (degenerative disc disease), cervical; Cervicalgia; and Chronic pain syndrome on their problem list. Today he comes in for evaluation of his LEFT NECK PAIN  Pain Assessment: Location: Posterior Neck Radiating: Radiates to left arm to left hand and fingers. Also radites from neck to upper back on left side.  Onset: More than a month ago Duration: Chronic pain Quality: Aching, Burning, Nagging, Tingling Severity: 7 /10 (subjective, self-reported pain score)  Note: Reported level is compatible with observation.                         When using our objective Pain Scale, levels between 6 and 10/10 are said to belong in an emergency room, as it progressively worsens from a 6/10, described as severely limiting, requiring emergency care not usually available at an outpatient pain management facility. At a 6/10 level, communication becomes difficult and requires great effort. Assistance to reach the emergency department may be required. Facial flushing and profuse sweating along with potentially dangerous increases in heart rate and blood pressure will be  evident. Effect on ADL: "Difficult to work because of the pain. Drving to work is a problem right now. Very difficult to sit"  Timing: Intermittent Modifying factors: Lying down and reclining helps ease the pain BP: (!) 127/96  HR: 88  Onset and Duration: Gradual, Date of onset: 20 and Present longer than 3 months Cause of pain: Work related accident or event Severity: Getting worse, NAS-11 at its worse: 10/10, NAS-11 at its best: 7/10 and NAS-11 now: 10/10 Timing: Not influenced by the time of the day Aggravating Factors: Nerve blocks, Prolonged sitting, Prolonged standing and Working Alleviating Factors: Lying down and TENS Associated Problems: Depression, Inability to concentrate, Numbness, Personality changes, Spasms, Tingling and Weakness Quality of Pain: Aching, Annoying, Burning, Deep, Disabling, Getting longer, Horrible, Nagging and Tingling Previous Examinations or Tests: CT scan, MRI scan and X-rays Previous Treatments: Chiropractic manipulations, Physical Therapy and TENS  The patient comes into the clinics today for the first time for a chronic pain management evaluation.  Patient is a very pleasant 47 year old male who presents with neck pain that radiates down to his mid upper back, left shoulder and down his left arm in a posterior distribution.  Patient was previously in the [employer] and states that he has been dealing with chronic neck pain for many years.  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  He has tried various therapeutic modalities including NSAIDs, Tylenol, physical therapy, aquatic therapy, TENS unit, acupressure, chiropractor.  States that the pain is very distressing and impacting his quality of life.  We will focus primarily on interventional pain management as the patient wants to avoid medications   Historic Controlled Substance Pharmacotherapy Review  Historical Background Evaluation: [place] PMP:  Six (6) year initial data search conducted.             [place] Department of public safety, offender search: Editor, commissioning Information) Non-contributory Risk Assessment Profile: Aberrant behavior: None observed or detected today Risk factors for fatal opioid overdose: None identified today Fatal overdose hazard ratio (HR): Calculation deferred Non-fatal overdose hazard ratio (HR): Calculation deferred Risk of opioid abuse or dependence: 0.7-3.0% with doses ? 36 MME/day and 6.1-26% with doses ? 120 MME/day. Substance use disorder (SUD) risk level: See below Personal History of Substance Abuse (SUD-Substance use disorder):  Alcohol: Negative  Illegal Drugs: Negative  Rx Drugs: Negative  ORT Risk Level calculation: Low Risk Opioid Risk Tool - 10/03/18 1407      Family History of Substance Abuse   Alcohol  Negative    Illegal Drugs  Negative    Rx Drugs  Negative      Personal History of Substance Abuse   Alcohol  Negative    Illegal Drugs  Negative    Rx Drugs  Negative      Age   Age between 37-45 years   No      History of Preadolescent Sexual Abuse   History of Preadolescent Sexual Abuse  Negative or Male      Psychological Disease   Psychological Disease  Negative    Depression  Negative      Total Score   Opioid Risk Tool Scoring  0    Opioid Risk Interpretation  Low Risk      ORT Scoring interpretation table:  Score <3 = Low Risk for SUD  Score between 4-7 = Moderate Risk for SUD  Score >8 = High Risk for Opioid Abuse   PHQ-2 Depression Scale:  Total score: 0  PHQ-2 Scoring interpretation table: (Score and probability of major depressive disorder)  Score 0 = No depression  Score 1 = 15.4% Probability  Score 2 = 21.1% Probability  Score 3 = 38.4% Probability  Score 4 = 45.5% Probability  Score 5 = 56.4% Probability  Score 6 = 78.6% Probability   PHQ-9 Depression Scale:  Total score: 0  PHQ-9 Scoring interpretation table:  Score 0-4 = No depression  Score 5-9 =  Mild depression  Score 10-14 = Moderate depression  Score 15-19 = Moderately severe depression  Score 20-27 = Severe depression (2.4 times higher risk of SUD and 2.89 times higher risk of overuse)     Meds   Current Outpatient Medications:  .  cyclobenzaprine (FLEXERIL) 5 MG tablet, Take 5 mg by mouth 3 (three) times daily as needed for muscle spasms., Disp: , Rfl:  .  EPINEPHrine 0.3 mg/0.3 mL IJ SOAJ injection, Inject into the muscle once., Disp: , Rfl:  .  ketoconazole (NIZORAL) 200 MG tablet, Take by mouth daily., Disp: , Rfl:  .  naproxen (NAPROSYN) 500 MG tablet, Take 500 mg by mouth 2 (two) times daily with a meal., Disp: , Rfl:  .  pantoprazole (PROTONIX) 40 MG tablet, Take 40 mg by mouth daily., Disp: , Rfl:  .  selenium sulfide (SELSUN) 2.5 % shampoo, Apply 1 application topically daily as needed for irritation., Disp: , Rfl:   Imaging Review  Cervical Imaging: Cervical MR wo contrast:  Results for orders placed during the hospital encounter of 10/31/17  MR CERVICAL SPINE WO  CONTRAST   Narrative CLINICAL DATA:  47 year old male with chronic neck pain and stiffness. Decreased range of motion. Bilateral arm and hand numbness. Weakness worse on the left.  EXAM: MRI CERVICAL SPINE WITHOUT CONTRAST  TECHNIQUE: Multiplanar, multisequence MR imaging of the cervical spine was performed. No intravenous contrast was administered.  COMPARISON:  None.  FINDINGS: Alignment: Straightening of cervical lordosis. Subtle retrolisthesis of C3 on C4 and C4 on C5. Mild anterolisthesis of C7 on T1.  Vertebrae: No marrow edema or evidence of acute osseous abnormality. Visualized bone marrow signal is within normal limits.  Cord: Spinal cord signal is within normal limits at all visualized levels.  Posterior Fossa, vertebral arteries, paraspinal tissues: Cervicomedullary junction is within normal limits. Negative visualized posterior fossa. Preserved bilateral major vascular  flow voids in the neck. Negative neck soft tissues and visible right lung apex.  Disc levels:  C2-C3:  Negative.  C3-C4: Mild disc space loss with circumferential disc bulge. Broad-based posterior and right greater than left foraminal involvement with endplate spurring. Mild spinal stenosis with no spinal cord mass effect. Mild to moderate right C4 foraminal stenosis.  C4-C5: Right eccentric circumferential disc bulge and endplate spurring. Broad-based right foraminal component. Spinal stenosis with borderline to mild right hemi cord mass effect. Moderate left and severe right C5 foraminal stenosis (series 5, image 12).  C5-C6: Circumferential disc bulge with superimposed broad-based right lateral recess to foraminal disc protrusion (series 5, image 17). Superimposed endplate spurring. Borderline to mild associated spinal stenosis, but moderate to severe right C6 neural foraminal stenosis. There is mild contralateral left foraminal stenosis.  C6-C7: Circumferential disc bulge and endplate spurring with broad-based posterior and left greater than right foraminal components. Broad-based left foraminal component of disc suspected (series 5, image 20). No spinal stenosis. Severe left and borderline to mild right C7 neural foraminal stenosis.  C7-T1: Mild anterolisthesis. Moderate right and moderate to severe left facet hypertrophy. Associated left side degenerative subchondral cysts (series 5, images 26 and 27) which do not appear to be contributing to stenosis at this time. Moderate ligament flavum hypertrophy. Minimal disc bulge. Mild foraminal endplate spurring moderate left greater than right C8 foraminal stenosis.  No upper thoracic spinal or foraminal stenosis.  IMPRESSION: 1. With regard to the left side radiculitis the most significant levels are C6-C7 and C7-T1: - C6-C7 disc and endplate degeneration with bulky left foraminal component of disc suspected and severe  left foraminal stenosis. Query left C7 radiculitis. - mild spondylolisthesis at C7-T1 with advanced facet degeneration and moderate bilateral C8 neural foraminal stenosis, greater on the left. 2. Disc and endplate degeneration A7-G8 through C5-C6 with up to mild spinal stenosis, and up to moderate/severe neural foraminal stenosis greater on the right. - Query right C5 and/or C6 radiculitis. - Mild if any spinal cord mass effect, no cord signal abnormality.   Electronically Signed   By: Genevie Ann M.D.   On: 10/31/2017 15:00    Foot Imaging: Foot-R DG Complete:  Results for orders placed during the hospital encounter of 05/21/16  DG Foot Complete Right   Narrative CLINICAL DATA:  Lateral RIGHT foot numbness at 4th to 5th metatarsals for 2 weeks, no history of trauma or injury, history degenerative disc disease  EXAM: RIGHT FOOT COMPLETE - 3+ VIEW  COMPARISON:  None  FINDINGS: Osseous mineralization normal.  Joint spaces preserved.  No acute fracture, dislocation or bone destruction.  Soft tissues radiographically unremarkable.  IMPRESSION: Normal exam.   Electronically Signed   By:  Lavonia Dana M.D.   On: 05/21/2016 10:19      Complexity Note: Imaging results reviewed. Results shared with Mr. Fonda, using Layman's terms.                         ROS  Cardiovascular: No reported cardiovascular signs or symptoms such as High blood pressure, coronary artery disease, abnormal heart rate or rhythm, heart attack, blood thinner therapy or heart weakness and/or failure Pulmonary or Respiratory: Snoring  and Exposure to tuberculosis Neurological: No reported neurological signs or symptoms such as seizures, abnormal skin sensations, urinary and/or fecal incontinence, being born with an abnormal open spine and/or a tethered spinal cord Review of Past Neurological Studies: No results found for this or any previous visit. Psychological-Psychiatric:  Depressed Gastrointestinal: No reported gastrointestinal signs or symptoms such as vomiting or evacuating blood, reflux, heartburn, alternating episodes of diarrhea and constipation, inflamed or scarred liver, or pancreas or irrregular and/or infrequent bowel movements Genitourinary: No reported renal or genitourinary signs or symptoms such as difficulty voiding or producing urine, peeing blood, non-functioning kidney, kidney stones, difficulty emptying the bladder, difficulty controlling the flow of urine, or chronic kidney disease Hematological: No reported hematological signs or symptoms such as prolonged bleeding, low or poor functioning platelets, bruising or bleeding easily, hereditary bleeding problems, low energy levels due to low hemoglobin or being anemic Endocrine: No reported endocrine signs or symptoms such as high or low blood sugar, rapid heart rate due to high thyroid levels, obesity or weight gain due to slow thyroid or thyroid disease Rheumatologic: No reported rheumatological signs and symptoms such as fatigue, joint pain, tenderness, swelling, redness, heat, stiffness, decreased range of motion, with or without associated rash Musculoskeletal: Negative for myasthenia gravis, muscular dystrophy, multiple sclerosis or malignant hyperthermia Work History: Working full time  Allergies  Mr. Hlavacek is allergic to bee venom.  Laboratory Chemistry  Inflammation Markers (CRP: Acute Phase) (ESR: Chronic Phase) No results found for: CRP, ESRSEDRATE, LATICACIDVEN                       Rheumatology Markers No results found for: RF, ANA, LABURIC, URICUR, LYMEIGGIGMAB, LYMEABIGMQN, HLAB27                      Renal Function Markers No results found for: BUN, CREATININE, BCR, GFRAA, GFRNONAA                           Hepatic Function Markers No results found for: AST, ALT, ALBUMIN, ALKPHOS, HCVAB, AMYLASE, LIPASE, AMMONIA                      Electrolytes No results found for: NA, K,  CL, CALCIUM, MG, PHOS                      Neuropathy Markers No results found for: VITAMINB12, FOLATE, HGBA1C, HIV                      CNS Tests No results found for: COLORCSF, APPEARCSF, RBCCOUNTCSF, WBCCSF, POLYSCSF, LYMPHSCSF, EOSCSF, PROTEINCSF, GLUCCSF, JCVIRUS, CSFOLI, IGGCSF                      Bone Pathology Markers No results found for: [place], [id number], [id number], [id number], 25OHVITD1, 25OHVITD2, 25OHVITD3, TESTOFREE, TESTOSTERONE  Coagulation Parameters No results found for: INR, LABPROT, APTT, PLT, DDIMER, LABHEMA                      Cardiovascular Markers No results found for: BNP, CKTOTAL, CKMB, TROPONINI, HGB, HCT                       CA Markers No results found for: CEA, CA125, LABCA2                      Note: Lab results reviewed.  [place]  Drug: Mr. Reaume  reports that he does not use drugs. Alcohol:  reports that he drinks alcohol. Tobacco:  reports that he has never smoked. He has never used smokeless tobacco. Medical:  has a past medical history of Anginal pain ([place]), DJD (degenerative joint disease), GERD (gastroesophageal reflux disease), H/O degenerative disc disease, Rhinitis, and Tinea. Family: family history includes Diabetes in his mother; Healthy in his father.  Past Surgical History:  Procedure Laterality Date  . ESOPHAGOGASTRODUODENOSCOPY (EGD) WITH PROPOFOL N/A 12/01/2017   Procedure: ESOPHAGOGASTRODUODENOSCOPY (EGD) WITH PROPOFOL;  Surgeon: Lollie Sails, MD;  Location: [facility] ENDOSCOPY;  Service: Endoscopy;  Laterality: N/A;  . VASECTOMY    . VASECTOMY     Active Ambulatory Problems    Diagnosis Date Noted  . Cervical radiculopathy (LEFT C6,7) 10/03/2018  . DDD (degenerative disc disease), cervical 10/03/2018  . Cervicalgia 10/03/2018  . Chronic pain syndrome 10/03/2018   Resolved Ambulatory Problems    Diagnosis Date Noted  . No Resolved Ambulatory Problems   Past Medical History:  Diagnosis Date  .  Anginal pain ([place])   . DJD (degenerative joint disease)   . GERD (gastroesophageal reflux disease)   . H/O degenerative disc disease   . Rhinitis   . Tinea    Constitutional Exam  General appearance: Well nourished, well developed, and well hydrated. In no apparent acute distress Vitals:   10/03/18 1357  BP: (!) 127/96  Pulse: 88  Resp: 18  Temp: 97.9 F (36.6 C)  TempSrc: Oral  SpO2: 100%  Weight: 215 lb (97.5 kg)  Height: 5' 11"  (1.803 m)   BMI Assessment: Estimated body mass index is 29.99 kg/m as calculated from the following:   Height as of this encounter: 5' 11"  (1.803 m).   Weight as of this encounter: 215 lb (97.5 kg).  BMI interpretation table: BMI level Category Range association with higher incidence of chronic pain  <18 kg/m2 Underweight   18.5-24.9 kg/m2 Ideal body weight   25-29.9 kg/m2 Overweight Increased incidence by 20%  30-34.9 kg/m2 Obese (Class I) Increased incidence by 68%  35-39.9 kg/m2 Severe obesity (Class II) Increased incidence by 136%  >40 kg/m2 Extreme obesity (Class III) Increased incidence by 254%   Patient's current BMI Ideal Body weight  Body mass index is 29.99 kg/m. Ideal body weight: 75.3 kg (166 lb 0.1 oz) Adjusted ideal body weight: 84.2 kg (185 lb 9.7 oz)   BMI Readings from Last 4 Encounters:  10/03/18 29.99 kg/m  12/01/17 30.68 kg/m  02/14/17 30.68 kg/m  08/21/16 30.68 kg/m   Wt Readings from Last 4 Encounters:  10/03/18 215 lb (97.5 kg)  12/01/17 220 lb (99.8 kg)  02/14/17 220 lb (99.8 kg)  08/21/16 220 lb (99.8 kg)  Psych/Mental status: Alert, oriented x 3 (person, place, & time)       Eyes: PERLA Respiratory: No evidence of acute respiratory distress  Cervical Spine Area Exam  Skin & Axial Inspection: No masses, redness, edema, swelling, or associated skin lesions Alignment: Symmetrical Functional ROM: Decreased ROM, to the left Stability: No instability detected Muscle Tone/Strength: Functionally intact. No  obvious neuro-muscular anomalies detected. Sensory (Neurological): Dermatomal pain pattern left C6, C7, T1 Palpation: No palpable anomalies              Upper Extremity (UE) Exam    Side: Right upper extremity  Side: Left upper extremity  Skin & Extremity Inspection: Skin color, temperature, and hair growth are WNL. No peripheral edema or cyanosis. No masses, redness, swelling, asymmetry, or associated skin lesions. No contractures.  Skin & Extremity Inspection: Skin color, temperature, and hair growth are WNL. No peripheral edema or cyanosis. No masses, redness, swelling, asymmetry, or associated skin lesions. No contractures.  Functional ROM: Unrestricted ROM          Functional ROM: Decreased ROM for shoulder  Muscle Tone/Strength: Functionally intact. No obvious neuro-muscular anomalies detected.  Muscle Tone/Strength: Functionally intact. No obvious neuro-muscular anomalies detected.  Sensory (Neurological): Unimpaired          Sensory (Neurological): Dermatomal pain pattern          Palpation: No palpable anomalies              Palpation: No palpable anomalies              Provocative Test(s):  Phalen's test: deferred Tinel's test: deferred Apley's scratch test (touch opposite shoulder):  Action 1 (Across chest): deferred Action 2 (Overhead): deferred Action 3 (LB reach): deferred   Provocative Test(s):  Phalen's test: deferred Tinel's test: deferred Apley's scratch test (touch opposite shoulder):  Action 1 (Across chest): deferred Action 2 (Overhead): deferred Action 3 (LB reach): deferred    Thoracic Spine Area Exam  Skin & Axial Inspection: No masses, redness, or swelling Alignment: Symmetrical Functional ROM: Unrestricted ROM Stability: No instability detected Muscle Tone/Strength: Functionally intact. No obvious neuro-muscular anomalies detected. Sensory (Neurological): Unimpaired Muscle strength & Tone: No palpable anomalies  Lumbar Spine Area Exam  Skin & Axial  Inspection: No masses, redness, or swelling Alignment: Symmetrical Functional ROM: Unrestricted ROM       Stability: No instability detected Muscle Tone/Strength: Functionally intact. No obvious neuro-muscular anomalies detected. Sensory (Neurological): Unimpaired Palpation: No palpable anomalies       Provocative Tests: Hyperextension/rotation test: deferred today       Lumbar quadrant test (Kemp's test): deferred today       Lateral bending test: deferred today       Patrick's Maneuver: deferred today                   FABER test: deferred today                   S-I anterior distraction/compression test: deferred today         S-I lateral compression test: deferred today         S-I Thigh-thrust test: deferred today         S-I Gaenslen's test: deferred today          Gait & Posture Assessment  Ambulation: Unassisted Gait: Relatively normal for age and body habitus Posture: WNL   Lower Extremity Exam    Side: Right lower extremity  Side: Left lower extremity  Stability: No instability observed          Stability: No instability observed  Skin & Extremity Inspection: Skin color, temperature, and hair growth are WNL. No peripheral edema or cyanosis. No masses, redness, swelling, asymmetry, or associated skin lesions. No contractures.  Skin & Extremity Inspection: Skin color, temperature, and hair growth are WNL. No peripheral edema or cyanosis. No masses, redness, swelling, asymmetry, or associated skin lesions. No contractures.  Functional ROM: Unrestricted ROM                  Functional ROM: Unrestricted ROM                  Muscle Tone/Strength: Functionally intact. No obvious neuro-muscular anomalies detected.  Muscle Tone/Strength: Functionally intact. No obvious neuro-muscular anomalies detected.  Sensory (Neurological): Unimpaired        Sensory (Neurological): Unimpaired        DTR: Patellar: deferred today Achilles: deferred today Plantar: deferred today   DTR: Patellar: deferred today Achilles: deferred today Plantar: deferred today  Palpation: No palpable anomalies  Palpation: No palpable anomalies   Assessment  Primary Diagnosis & Pertinent Problem List: The primary encounter diagnosis was Cervical radiculopathy (LEFT C6,7). Diagnoses of DDD (degenerative disc disease), cervical, Cervicalgia, and Chronic pain syndrome were also pertinent to this visit.  Visit Diagnosis (New problems to examiner): 1. Cervical radiculopathy (LEFT C6,7)   2. DDD (degenerative disc disease), cervical   3. Cervicalgia   4. Chronic pain syndrome    47 year old male with a history of left-sided neck pain that radiates to his left arm and left hand in a dermatomal fashion secondary to cervical degenerative disc disease, cervical disc herniations resulting in cervical radiculopathy most pronounced at left C6, left C7.  Patient has tried and failed various conservative measures including physical therapy, acupuncture, chiropractic therapy, NSAIDs, heat, TENS.  We discussed risks and benefits of cervical epidural steroid injection.  Patient would like to proceed.  We will get him scheduled for left C7-T1 ESI with sedation.  Plan of Care (Initial workup plan)   Ordered Lab-work, Procedure(s), Referral(s), & Consult(s): Orders Placed This Encounter  Procedures  . Cervical Epidural Injection   Provider-requested follow-up: Return in about 5 days (around 10/08/2018) for Procedure.  Future Appointments  Date Time Provider [place]  10/08/2018  9:00 AM Gillis Santa, MD [facility] None    Primary Care Physician: Sallee Lange, NP Location: [facility] Management Facility Note by: Gillis Santa, M.D, Date: 10/03/2018; Time: 3:36 PM  Patient Instructions  ASAP for L Cervical ESI  Epidural Steroid Injection Patient Information  Description: The epidural space surrounds the nerves as they exit the spinal cord.  In some patients, the  nerves can be compressed and inflamed by a bulging disc or a tight spinal canal (spinal stenosis).  By injecting steroids into the epidural space, we can bring irritated nerves into direct contact with a potentially helpful medication.  These steroids act directly on the irritated nerves and can reduce swelling and inflammation which often leads to decreased pain.  Epidural steroids may be injected anywhere along the spine and from the neck to the low back depending upon the location of your pain.   After numbing the skin with local anesthetic (like Novocaine), a small needle is passed into the epidural space slowly.  You may experience a sensation of pressure while this is being done.  The entire block usually last less than 10 minutes.  Conditions which may be treated by epidural steroids:   Low back and leg pain  Neck and arm pain  Spinal stenosis  Post-laminectomy syndrome  Herpes zoster (shingles) pain  Pain from compression fractures  Preparation for the injection:  1. Do not eat any solid food or dairy products within 8 hours of your appointment.  2. You may drink clear liquids up to 3 hours before appointment.  Clear liquids include water, black coffee, juice or soda.  No milk or cream please. 3. You may take your regular medication, including pain medications, with a sip of water before your appointment  Diabetics should hold regular insulin (if taken separately) and take 1/2 normal NPH dos the morning of the procedure.  Carry some sugar containing items with you to your appointment. 4. A driver must accompany you and be prepared to drive you home after your procedure.  5. Bring all your current medications with your. 6. An IV may be inserted and sedation may be given at the discretion of the physician.   7. A blood pressure cuff, EKG and other monitors will often be applied during the procedure.  Some patients may need to have extra oxygen administered for a short period. 8. You  will be asked to provide medical information, including your allergies, prior to the procedure.  We must know immediately if you are taking blood thinners (like Coumadin/Warfarin)  Or if you are allergic to IV iodine contrast (dye). We must know if you could possible be pregnant.  Possible side-effects:  Bleeding from needle site  Infection (rare, may require surgery)  Nerve injury (rare)  Numbness & tingling (temporary)  Difficulty urinating (rare, temporary)  Spinal headache ( a headache worse with upright posture)  Light -headedness (temporary)  Pain at injection site (several days)  Decreased blood pressure (temporary)  Weakness in arm/leg (temporary)  Pressure sensation in back/neck (temporary)  Call if you experience:  Fever/chills associated with headache or increased back/neck pain.  Headache worsened by an upright position.  New onset weakness or numbness of an extremity below the injection site  Hives or difficulty breathing (go to the emergency room)  Inflammation or drainage at the infection site  Severe back/neck pain  Any new symptoms which are concerning to you  Please note:  Although the local anesthetic injected can often make your back or neck feel good for several hours after the injection, the pain will likely return.  It takes 3-7 days for steroids to work in the epidural space.  You may not notice any pain relief for at least that one week.  If effective, we will often do a series of three injections spaced 3-6 weeks apart to maximally decrease your pain.  After the initial series, we generally will wait several months before considering a repeat injection of the same type.  If you have any questions, please call [phone] [facility] ____________________________________________________________________________________________  Preparing for Procedure with Sedation  Instructions: . Oral Intake: Do not  eat or drink anything for at least 8 hours prior to your procedure. . Transportation: Public transportation is not allowed. Bring an adult driver. The driver must be physically present in our waiting room before any procedure can be started. Marland Kitchen Physical Assistance: Bring an adult physically capable of assisting you, in the event you need help. This adult should keep you company at home for at least 6 hours after the procedure. . Blood Pressure Medicine: Take your blood pressure medicine with a sip of water the morning of the procedure. . Blood thinners: Notify our staff if you are taking any  blood thinners. Depending on which one you take, there will be specific instructions on how and when to stop it. . Diabetics on insulin: Notify the staff so that you can be scheduled 1st case in the morning. If your diabetes requires high dose insulin, take only  of your normal insulin dose the morning of the procedure and notify the staff that you have done so. . Preventing infections: Shower with an antibacterial soap the morning of your procedure. . Build-up your immune system: Take 1000 mg of Vitamin C with every meal (3 times a day) the day prior to your procedure. Marland Kitchen Antibiotics: Inform the staff if you have a condition or reason that requires you to take antibiotics before dental procedures. . Pregnancy: If you are pregnant, call and cancel the procedure. . Sickness: If you have a cold, fever, or any active infections, call and cancel the procedure. . Arrival: You must be in the facility at least 30 minutes prior to your scheduled procedure. . Children: Do not bring children with you. . Dress appropriately: Bring dark clothing that you would not mind if they get stained. . Valuables: Do not bring any jewelry or valuables.  Procedure appointments are reserved for interventional treatments only. Marland Kitchen No Prescription Refills. . No medication changes will be discussed during procedure appointments. . No  disability issues will be discussed.  Reasons to call and reschedule or cancel your procedure: (Following these recommendations will minimize the risk of a serious complication.) . Surgeries: Avoid having procedures within 2 weeks of any surgery. (Avoid for 2 weeks before or after any surgery). . Flu Shots: Avoid having procedures within 2 weeks of a flu shots or . (Avoid for 2 weeks before or after immunizations). . Barium: Avoid having a procedure within 7-10 days after having had a radiological study involving the use of radiological contrast. (Myelograms, Barium swallow or enema study). . Heart attacks: Avoid any elective procedures or surgeries for the initial 6 months after a "Myocardial Infarction" (Heart Attack). . Blood thinners: It is imperative that you stop these medications before procedures. Let us know if you if you take any blood thinner.  . Infection: Avoid procedures during or within two weeks of an infection (including chest colds or gastrointestinal problems). Symptoms associated with infections include: Localized redness, fever, chills, night sweats or profuse sweating, burning sensation when voiding, cough, congestion, stuffiness, runny nose, sore throat, diarrhea, nausea, vomiting, cold or Flu symptoms, recent or current infections. It is specially important if the infection is over the area that we intend to treat. Marland Kitchen Heart and lung problems: Symptoms that may suggest an active cardiopulmonary problem include: cough, chest pain, breathing difficulties or shortness of breath, dizziness, ankle swelling, uncontrolled high or unusually low blood pressure, and/or palpitations. If you are experiencing any of these symptoms, cancel your procedure and contact your primary care physician for an evaluation.  Remember:  Regular Business hours are:  Monday to Thursday 8:00 AM to 4:00 PM  Provider's Schedule: Milinda Pointer, MD:  Procedure days: Tuesday and Thursday 7:30 AM to 4:00  PM  Gillis Santa, MD:  Procedure days: Monday and Wednesday 7:30 AM to 4:00 PM ____________________________________________________________________________________________

## 2018-10-08 ENCOUNTER — Encounter: Payer: Self-pay | Admitting: Student in an Organized Health Care Education/Training Program

## 2018-10-08 ENCOUNTER — Other Ambulatory Visit: Payer: Self-pay

## 2018-10-08 ENCOUNTER — Ambulatory Visit
Admission: RE | Admit: 2018-10-08 | Discharge: 2018-10-08 | Disposition: A | Discharge status: Home / Self Care | Payer: BC Managed Care – PPO | Source: Ambulatory Visit | Attending: Student in an Organized Health Care Education/Training Program | Admitting: Student in an Organized Health Care Education/Training Program

## 2018-10-08 ENCOUNTER — Ambulatory Visit (HOSPITAL_BASED_OUTPATIENT_CLINIC_OR_DEPARTMENT_OTHER): Payer: BC Managed Care – PPO | Admitting: Student in an Organized Health Care Education/Training Program

## 2018-10-08 VITALS — BP 128/100 | HR 81 | Temp 98.5°F | Resp 11 | Ht 71.0 in | Wt 215.0 lb

## 2018-10-08 DIAGNOSIS — M5412 Radiculopathy, cervical region: Secondary | ICD-10-CM | POA: Insufficient documentation

## 2018-10-08 MED ORDER — LACTATED RINGERS IV SOLN
1000.0000 mL | Freq: Once | INTRAVENOUS | Status: AC
  Administered 2018-10-08: 1000 mL via INTRAVENOUS

## 2018-10-08 MED ORDER — FENTANYL CITRATE (PF) 100 MCG/2ML IJ SOLN
25.0000 ug | INTRAMUSCULAR | Status: DC | PRN
  Administered 2018-10-08: 50 ug via INTRAVENOUS

## 2018-10-08 MED ORDER — IOPAMIDOL (ISOVUE-M 200) INJECTION 41%
INTRAMUSCULAR | Status: AC
  Filled 2018-10-08: qty 10

## 2018-10-08 MED ORDER — FENTANYL CITRATE (PF) 100 MCG/2ML IJ SOLN
INTRAMUSCULAR | Status: AC
  Filled 2018-10-08: qty 2

## 2018-10-08 MED ORDER — ROPIVACAINE HCL 2 MG/ML IJ SOLN
INTRAMUSCULAR | Status: AC
  Filled 2018-10-08: qty 10

## 2018-10-08 MED ORDER — SODIUM CHLORIDE (PF) 0.9 % IJ SOLN
INTRAMUSCULAR | Status: AC
  Filled 2018-10-08: qty 10

## 2018-10-08 MED ORDER — DEXAMETHASONE SODIUM PHOSPHATE 10 MG/ML IJ SOLN
INTRAMUSCULAR | Status: AC
  Filled 2018-10-08: qty 1

## 2018-10-08 MED ORDER — DEXAMETHASONE SODIUM PHOSPHATE 10 MG/ML IJ SOLN
10.0000 mg | Freq: Once | INTRAMUSCULAR | Status: AC
  Administered 2018-10-08: 10 mg

## 2018-10-08 MED ORDER — IOPAMIDOL (ISOVUE-M 200) INJECTION 41%
10.0000 mL | Freq: Once | INTRAMUSCULAR | Status: AC
  Administered 2018-10-08: 1 mL via EPIDURAL

## 2018-10-08 MED ORDER — ROPIVACAINE HCL 2 MG/ML IJ SOLN
1.0000 mL | Freq: Once | INTRAMUSCULAR | Status: AC
  Administered 2018-10-08: 1 mL via EPIDURAL

## 2018-10-08 MED ORDER — LIDOCAINE HCL 2 % IJ SOLN
INTRAMUSCULAR | Status: AC
  Filled 2018-10-08: qty 20

## 2018-10-08 MED ORDER — SODIUM CHLORIDE 0.9% FLUSH
1.0000 mL | Freq: Once | INTRAVENOUS | Status: AC
  Administered 2018-10-08: 1 mL

## 2018-10-08 MED ORDER — LIDOCAINE HCL 2 % IJ SOLN
10.0000 mL | Freq: Once | INTRAMUSCULAR | Status: AC
  Administered 2018-10-08: 400 mg

## 2018-10-08 NOTE — Progress Notes (Signed)
Safety precautions to be maintained throughout the outpatient stay will include: orient to surroundings, keep bed in low position, maintain call bell within reach at all times, provide assistance with transfer out of bed and ambulation.  

## 2018-10-08 NOTE — Patient Instructions (Signed)

## 2018-10-08 NOTE — Progress Notes (Signed)
Patient's Name: Kyle Fields  MRN: [id number]  Referring Provider: Myrene Buddy, *  DOB: 1971-01-21  PCP: Myrene Buddy, NP  DOS: 10/08/2018  Note by: Edward Jolly, MD  Service setting: Ambulatory outpatient  Specialty: Interventional Pain Management  Patient type: Established  Location: [facility] (AMB) Pain Management Facility  Visit type: Interventional Procedure   Primary Reason for Visit: Interventional Pain Management Treatment. CC: No chief complaint on file.  Procedure:          Anesthesia, Analgesia, Anxiolysis:  Type: Diagnostic, Inter-Laminar, Cervical Epidural Steroid Injection  #1  Region: Posterior Cervico-thoracic Region Level: C7-T1 Laterality: Left-Sided Paramedial  Type: Moderate (Conscious) Sedation combined with Local Anesthesia Indication(s): Analgesia and Anxiety Route: Intravenous (IV) IV Access: Secured Sedation: Meaningful verbal contact was maintained at all times during the procedure  Local Anesthetic: Lidocaine 1-2%  Position: Prone with head of the table was raised to facilitate breathing.   Indications: 1. Cervical radiculopathy (LEFT C6,7)    Pain Score: Pre-procedure: 7 /10 Post-procedure: 0-No pain/10  Pre-op Assessment:  Kyle Fields is a 47 y.o. (year old), male patient, seen today for interventional treatment. He  has a past surgical history that includes Vasectomy; Vasectomy; and Esophagogastroduodenoscopy (egd) with propofol (N/A, 12/01/2017). Kyle Fields has a current medication list which includes the following prescription(s): cyclobenzaprine, epinephrine, ketoconazole, naproxen, pantoprazole, and selenium sulfide, and the following Facility-Administered Medications: fentanyl. His primarily concern today is the No chief complaint on file.  Initial Vital Signs:  Pulse/HCG Rate: 89ECG Heart Rate: 90 Temp: 98 F (36.7 C) Resp: 18 BP: (!) 139/103 SpO2: 100 %  BMI: Estimated body mass index is 29.99 kg/m as calculated from the  following:   Height as of this encounter: 5\' 11"  (1.803 m).   Weight as of this encounter: 215 lb (97.5 kg).  Risk Assessment: Allergies: Reviewed. He is allergic to bee venom.  Allergy Precautions: None required Coagulopathies: Reviewed. None identified.  Blood-thinner therapy: None at this time Active Infection(s): Reviewed. None identified. Kyle Fields is afebrile  Site Confirmation: Kyle Fields was asked to confirm the procedure and laterality before marking the site Procedure checklist: Completed Consent: Before the procedure and under the influence of no sedative(s), amnesic(s), or anxiolytics, the patient was informed of the treatment options, risks and possible complications. To fulfill our ethical and legal obligations, as recommended by the American Medical Association's Code of Ethics, I have informed the patient of my clinical impression; the nature and purpose of the treatment or procedure; the risks, benefits, and possible complications of the intervention; the alternatives, including doing nothing; the risk(s) and benefit(s) of the alternative treatment(s) or procedure(s); and the risk(s) and benefit(s) of doing nothing. The patient was provided information about the general risks and possible complications associated with the procedure. These may include, but are not limited to: failure to achieve desired goals, infection, bleeding, organ or nerve damage, allergic reactions, paralysis, and death. In addition, the patient was informed of those risks and complications associated to Spine-related procedures, such as failure to decrease pain; infection (i.e.: Meningitis, epidural or intraspinal abscess); bleeding (i.e.: epidural hematoma, subarachnoid hemorrhage, or any other type of intraspinal or peri-dural bleeding); organ or nerve damage (i.e.: Any type of peripheral nerve, nerve root, or spinal cord injury) with subsequent damage to sensory, motor, and/or autonomic systems,  resulting in permanent pain, numbness, and/or weakness of one or several areas of the body; allergic reactions; (i.e.: anaphylactic reaction); and/or death. Furthermore, the patient was informed of those risks and  complications associated with the medications. These include, but are not limited to: allergic reactions (i.e.: anaphylactic or anaphylactoid reaction(s)); adrenal axis suppression; blood sugar elevation that in diabetics may result in ketoacidosis or comma; water retention that in patients with history of congestive heart failure may result in shortness of breath, pulmonary edema, and decompensation with resultant heart failure; weight gain; swelling or edema; medication-induced neural toxicity; particulate matter embolism and blood vessel occlusion with resultant organ, and/or nervous system infarction; and/or aseptic necrosis of one or more joints. Finally, the patient was informed that Medicine is not an exact science; therefore, there is also the possibility of unforeseen or unpredictable risks and/or possible complications that may result in a catastrophic outcome. The patient indicated having understood very clearly. We have given the patient no guarantees and we have made no promises. Enough time was given to the patient to ask questions, all of which were answered to the patient's satisfaction. Kyle Fields has indicated that he wanted to continue with the procedure. Attestation: I, the ordering provider, attest that I have discussed with the patient the benefits, risks, side-effects, alternatives, likelihood of achieving goals, and potential problems during recovery for the procedure that I have provided informed consent. Date  Time: 10/08/2018  8:37 AM  Pre-Procedure Preparation:  Monitoring: As per clinic protocol. Respiration, ETCO2, SpO2, BP, heart rate and rhythm monitor placed and checked for adequate function Safety Precautions: Patient was assessed for positional comfort and  pressure points before starting the procedure. Time-out: I initiated and conducted the "Time-out" before starting the procedure, as per protocol. The patient was asked to participate by confirming the accuracy of the "Time Out" information. Verification of the correct person, site, and procedure were performed and confirmed by me, the nursing staff, and the patient. "Time-out" conducted as per Joint Commission's Universal Protocol (UP.01.01.01). Time: 0907  Description of Procedure:          Target Area: For Epidural Steroid injections the target is the interlaminar space, initially targeting the lower border of the superior vertebral body lamina. Approach: Paramedial approach. Area Prepped: Entire PosteriorCervical Region Prepping solution: ChloraPrep (2% chlorhexidine gluconate and 70% isopropyl alcohol) Safety Precautions: Aspiration looking for blood return was conducted prior to all injections. At no point did we inject any substances, as a needle was being advanced. No attempts were made at seeking any paresthesias. Safe injection practices and needle disposal techniques used. Medications properly checked for expiration dates. SDV (single dose vial) medications used. Description of the Procedure: Protocol guidelines were followed. The procedure needle was introduced through the skin, ipsilateral to the reported pain, and advanced to the target area. Bone was contacted and the needle walked caudad, until the lamina was cleared. The epidural space was identified using "loss-of-resistance technique" with 2-3 ml of PF-NaCl (0.9% NSS), in a 5cc LOR glass syringe. Vitals:   10/08/18 0914 10/08/18 0922 10/08/18 0932 10/08/18 0942  BP: (!) 116/95 (!) 134/94 (!) 131/95 (!) 128/100  Pulse:      Resp: 13 15 (!) 9 11  Temp:  98.3 F (36.8 C)  98.5 F (36.9 C)  TempSrc:  Temporal  Temporal  SpO2: 96% 98% 99% 99%  Weight:      Height:        Start Time: 0907 hrs. End Time: 0913 hrs. Materials:   Needle(s) Type: Epidural needle Gauge: 17G Length: 3.5-in Medication(s): Please see orders for medications and dosing details. 4 cc solution consisting of 2 cc of preservative-free saline, 1 cc of  0.2% ropivacaine, 1 cc of Decadron 10 mg/cc Imaging Guidance (Spinal):          Type of Imaging Technique: Fluoroscopy Guidance (Spinal) Indication(s): Assistance in needle guidance and placement for procedures requiring needle placement in or near specific anatomical locations not easily accessible without such assistance. Exposure Time: Please see nurses notes. Contrast: Before injecting any contrast, we confirmed that the patient did not have an allergy to iodine, shellfish, or radiological contrast. Once satisfactory needle placement was completed at the desired level, radiological contrast was injected. Contrast injected under live fluoroscopy. No contrast complications. See chart for type and volume of contrast used. Fluoroscopic Guidance: I was personally present during the use of fluoroscopy. "Tunnel Vision Technique" used to obtain the best possible view of the target area. Parallax error corrected before commencing the procedure. "Direction-depth-direction" technique used to introduce the needle under continuous pulsed fluoroscopy. Once target was reached, antero-posterior, oblique, and lateral fluoroscopic projection used confirm needle placement in all planes. Images permanently stored in EMR. Interpretation: I personally interpreted the imaging intraoperatively. Adequate needle placement confirmed in multiple planes. Appropriate spread of contrast into desired area was observed. No evidence of afferent or efferent intravascular uptake. No intrathecal or subarachnoid spread observed. Permanent images saved into the patient's record.  Antibiotic Prophylaxis:   Anti-infectives (From admission, onward)   None     Indication(s): None identified  Post-operative Assessment:  Post-procedure  Vital Signs:  Pulse/HCG Rate: 8182 Temp: 98.5 F (36.9 C) Resp: 11 BP: (!) 128/100 SpO2: 99 %  EBL: None  Complications: No immediate post-treatment complications observed by team, or reported by patient.  Note: The patient tolerated the entire procedure well. A repeat set of vitals were taken after the procedure and the patient was kept under observation following institutional policy, for this type of procedure. Post-procedural neurological assessment was performed, showing return to baseline, prior to discharge. The patient was provided with post-procedure discharge instructions, including a section on how to identify potential problems. Should any problems arise concerning this procedure, the patient was given instructions to immediately contact us, at any time, without hesitation. In any case, we plan to contact the patient by telephone for a follow-up status report regarding this interventional procedure.  Comments:  No additional relevant information. 5 out of 5 strength bilateral upper extremity: Shoulder abduction, elbow flexion, elbow extension, thumb extension.  Plan of Care    Imaging Orders     DG C-Arm 1-60 Min-No Report Procedure Orders    No procedure(s) ordered today    Medications ordered for procedure: Meds ordered this encounter  Medications  . lactated ringers infusion 1,000 mL  . fentaNYL (SUBLIMAZE) injection 25-100 mcg    Make sure Narcan is available in the pyxis when using this medication. In the event of respiratory depression (RR< 8/min): Titrate NARCAN (naloxone) in increments of 0.1 to 0.2 mg IV at 2-3 minute intervals, until desired degree of reversal.  . iopamidol (ISOVUE-M) 41 % intrathecal injection 10 mL  . dexamethasone (DECADRON) injection 10 mg  . ropivacaine (PF) 2 mg/mL (0.2%) (NAROPIN) injection 1 mL  . sodium chloride flush (NS) 0.9 % injection 1 mL  . lidocaine (XYLOCAINE) 2 % (with pres) injection 200 mg   Medications  administered: We administered lactated ringers, fentaNYL, iopamidol, dexamethasone, ropivacaine (PF) 2 mg/mL (0.2%), sodium chloride flush, and lidocaine.  See the medical record for exact dosing, route, and time of administration.  Disposition: Discharge home  Discharge Date & Time: 10/08/2018; 0946 hrs.  Physician-requested Follow-up: Return in about 3 weeks (around 10/29/2018) for Post Procedure Evaluation.  Future Appointments  Date Time Provider Department Center  10/24/2018  2:00 PM Edward Jolly, MD [facility] None   Primary Care Physician: Myrene Buddy, NP Location: [facility] Note by: Edward Jolly, MD Date: 10/08/2018; Time: 10:13 AM  Disclaimer:  Medicine is not an exact science. The only guarantee in medicine is that nothing is guaranteed. It is important to note that the decision to proceed with this intervention was based on the information collected from the patient. The Data and conclusions were drawn from the patient's questionnaire, the interview, and the physical examination. Because the information was provided in large part by the patient, it cannot be guaranteed that it has not been purposely or unconsciously manipulated. Every effort has been made to obtain as much relevant data as possible for this evaluation. It is important to note that the conclusions that lead to this procedure are derived in large part from the available data. Always take into account that the treatment will also be dependent on availability of resources and existing treatment guidelines, considered by other Pain Management Practitioners as being common knowledge and practice, at the time of the intervention. For Medico-Legal purposes, it is also important to point out that variation in procedural techniques and pharmacological choices are the acceptable norm. The indications, contraindications, technique, and results of the above procedure should only be  interpreted and judged by a Board-Certified Interventional Pain Specialist with extensive familiarity and expertise in the same exact procedure and technique.

## 2018-10-09 ENCOUNTER — Telehealth: Payer: Self-pay

## 2018-10-09 NOTE — Telephone Encounter (Signed)
Spoke with wife on the phone, denies any needs for her husband at this time. Instruced to call if needed.

## 2018-10-24 ENCOUNTER — Encounter: Payer: Self-pay | Admitting: Student in an Organized Health Care Education/Training Program

## 2018-10-24 ENCOUNTER — Other Ambulatory Visit: Payer: Self-pay

## 2018-10-24 ENCOUNTER — Ambulatory Visit
Payer: BC Managed Care – PPO | Attending: Student in an Organized Health Care Education/Training Program | Admitting: Student in an Organized Health Care Education/Training Program

## 2018-10-24 VITALS — BP 140/93 | HR 92 | Temp 98.2°F | Resp 16 | Ht 71.0 in | Wt 215.0 lb

## 2018-10-24 DIAGNOSIS — M542 Cervicalgia: Secondary | ICD-10-CM

## 2018-10-24 DIAGNOSIS — Z79899 Other long term (current) drug therapy: Secondary | ICD-10-CM | POA: Diagnosis not present

## 2018-10-24 DIAGNOSIS — M5412 Radiculopathy, cervical region: Secondary | ICD-10-CM

## 2018-10-24 DIAGNOSIS — K219 Gastro-esophageal reflux disease without esophagitis: Secondary | ICD-10-CM | POA: Diagnosis not present

## 2018-10-24 DIAGNOSIS — Z791 Long term (current) use of non-steroidal anti-inflammatories (NSAID): Secondary | ICD-10-CM | POA: Insufficient documentation

## 2018-10-24 DIAGNOSIS — M25512 Pain in left shoulder: Secondary | ICD-10-CM | POA: Insufficient documentation

## 2018-10-24 DIAGNOSIS — M5010 Cervical disc disorder with radiculopathy, unspecified cervical region: Secondary | ICD-10-CM | POA: Insufficient documentation

## 2018-10-24 DIAGNOSIS — G894 Chronic pain syndrome: Secondary | ICD-10-CM

## 2018-10-24 DIAGNOSIS — M5030 Other cervical disc degeneration, unspecified cervical region: Secondary | ICD-10-CM | POA: Diagnosis not present

## 2018-10-24 MED ORDER — METHYLPREDNISOLONE 4 MG PO TBPK: ORAL_TABLET | ORAL | 0 refills | Status: AC

## 2018-10-24 MED ORDER — ORPHENADRINE CITRATE 30 MG/ML IJ SOLN
60.0000 mg | Freq: Once | INTRAMUSCULAR | Status: AC
  Administered 2018-10-24: 60 mg via INTRAMUSCULAR
  Filled 2018-10-24: qty 2

## 2018-10-24 MED ORDER — GABAPENTIN 300 MG PO CAPS: ORAL_CAPSULE | ORAL | 2 refills | Status: DC

## 2018-10-24 MED ORDER — KETOROLAC TROMETHAMINE 60 MG/2ML IM SOLN
60.0000 mg | Freq: Once | INTRAMUSCULAR | Status: AC
  Administered 2018-10-24: 60 mg via INTRAMUSCULAR
  Filled 2018-10-24: qty 2

## 2018-10-24 NOTE — Progress Notes (Signed)
Patient's Name: Niall Illes  MRN: [id number]  Referring Provider: Myrene Buddy, *  DOB: 11-21-1970  PCP: Myrene Buddy, NP  DOS: 10/24/2018  Note by: Edward Jolly, MD  Service setting: Ambulatory outpatient  Specialty: Interventional Pain Management  Location: [facility] (AMB) Pain Management Facility    Patient type: Established   Primary Reason(s) for Visit: Encounter for post-procedure evaluation of chronic illness with mild to moderate exacerbation CC: Neck Pain (left) and Shoulder Pain (left)  HPI  Mr. Harner is a 47 y.o. year old, male patient, who comes today for a post-procedure evaluation. He has Cervical radiculopathy (LEFT C6,7); DDD (degenerative disc disease), cervical; Cervicalgia; and Chronic pain syndrome on their problem list. His primarily concern today is the Neck Pain (left) and Shoulder Pain (left)  Pain Assessment: Location: Left Neck Radiating: radiates into left shoulder and arm to the outside of left wrist Onset: More than a month ago Duration: Chronic pain Quality: Nagging, Aching, Burning, Tingling Severity: 9 /10 (subjective, self-reported pain score)  Note: Reported level is inconsistent with clinical observations. Clinically the patient looks like a 3/10 A 3/10 is viewed as "Moderate" and described as significantly interfering with activities of daily living (ADL). It becomes difficult to feed, bathe, get dressed, get on and off the toilet or to perform personal hygiene functions. Difficult to get in and out of bed or a chair without assistance. Very distracting. With effort, it can be ignored when deeply involved in activities. Information on the proper use of the pain scale provided to the patient today. When using our objective Pain Scale, levels between 6 and 10/10 are said to belong in an emergency room, as it progressively worsens from a 6/10, described as severely limiting, requiring emergency care not usually available at an outpatient pain  management facility. At a 6/10 level, communication becomes difficult and requires great effort. Assistance to reach the emergency department may be required. Facial flushing and profuse sweating along with potentially dangerous increases in heart rate and blood pressure will be evident. Effect on ADL: "It is difficult to do anything" Timing: Constant Modifying factors: procedure helped for 5 days BP: (!) 140/93  HR: 92  Mr. Buckles comes in today for post-procedure evaluation.  Further details on both, my assessment(s), as well as the proposed treatment plan, please see below.  Post-Procedure Assessment  10/08/2018 Procedure: Left C7-T1 ESI Pre-procedure pain score:  7/10 Post-procedure pain score: 0/10         Influential Factors: BMI: 29.99 kg/m Intra-procedural challenges: None observed.         Assessment challenges: None detected.              Reported side-effects: None.        Post-procedural adverse reactions or complications: None reported         Sedation: Please see nurses note. When no sedatives are used, the analgesic levels obtained are directly associated to the effectiveness of the local anesthetics. However, when sedation is provided, the level of analgesia obtained during the initial 1 hour following the intervention, is believed to be the result of a combination of factors. These factors may include, but are not limited to: 1. The effectiveness of the local anesthetics used. 2. The effects of the analgesic(s) and/or anxiolytic(s) used. 3. The degree of discomfort experienced by the patient at the time of the procedure. 4. The patients ability and reliability in recalling and recording the events. 5. The presence and influence of possible secondary gains and/or  psychosocial factors. Reported result: Relief experienced during the 1st hour after the procedure: 100 % (Ultra-Short Term Relief)            Interpretative annotation: Clinically appropriate result. Analgesia  during this period is likely to be Local Anesthetic and/or IV Sedative (Analgesic/Anxiolytic) related.          Effects of local anesthetic: The analgesic effects attained during this period are directly associated to the localized infiltration of local anesthetics and therefore cary significant diagnostic value as to the etiological location, or anatomical origin, of the pain. Expected duration of relief is directly dependent on the pharmacodynamics of the local anesthetic used. Long-acting (4-6 hours) anesthetics used.  Reported result: Relief during the next 4 to 6 hour after the procedure: 100 % (Short-Term Relief)            Interpretative annotation: Clinically appropriate result. Analgesia during this period is likely to be Local Anesthetic-related.          Long-term benefit: Defined as the period of time past the expected duration of local anesthetics (1 hour for short-acting and 4-6 hours for long-acting). With the possible exception of prolonged sympathetic blockade from the local anesthetics, benefits during this period are typically attributed to, or associated with, other factors such as analgesic sensory neuropraxia, antiinflammatory effects, or beneficial biochemical changes provided by agents other than the local anesthetics.  Reported result: Extended relief following procedure: Approximately 40 to 50% pain relief in the left forearm and hand, return of left shoulder pain quicker. (pain slowly returned after 10 days of progressively getting worse) (Long-Term Relief)            Interpretative annotation: Clinically possible results. Good relief. No permanent benefit expected. Inflammation plays a part in the etiology to the pain.          Current benefits: Defined as reported results that persistent at this point in time.   Analgesia: 25-50 %            Function: Somewhat improved ROM: Somewhat improved Interpretative annotation: Recurrence of symptoms. No permanent benefit expected.  Effective diagnostic intervention.          Interpretation: Results would suggest a successful diagnostic intervention.                  Plan:  Please see "Plan of Care" for details.                  Recent Diagnostic Imaging Results  DG C-Arm 1-60 Min-No Report Fluoroscopy was utilized by the requesting physician.  No radiographic  interpretation.   Complexity Note: Imaging results reviewed. Results shared with Mr. Busta, using Layman's terms.                         Meds   Current Outpatient Medications:  .  cyclobenzaprine (FLEXERIL) 5 MG tablet, Take 5 mg by mouth 3 (three) times daily as needed for muscle spasms., Disp: , Rfl:  .  EPINEPHrine 0.3 mg/0.3 mL IJ SOAJ injection, Inject into the muscle once., Disp: , Rfl:  .  ketoconazole (NIZORAL) 200 MG tablet, Take by mouth daily., Disp: , Rfl:  .  naproxen (NAPROSYN) 500 MG tablet, Take 500 mg by mouth 2 (two) times daily with a meal., Disp: , Rfl:  .  pantoprazole (PROTONIX) 40 MG tablet, Take 40 mg by mouth daily., Disp: , Rfl:  .  selenium sulfide (SELSUN) 2.5 % shampoo, Apply 1  application topically daily as needed for irritation., Disp: , Rfl:  .  gabapentin (NEURONTIN) 300 MG capsule, 300 mg qhs x 2 weeks then increase to BID, Disp: 90 capsule, Rfl: 2 .  methylPREDNISolone (MEDROL) 4 MG TBPK tablet, Follow package instructions., Disp: 21 tablet, Rfl: 0  ROS  Constitutional: Denies any fever or chills Gastrointestinal: No reported hemesis, hematochezia, vomiting, or acute GI distress Musculoskeletal: Denies any acute onset joint swelling, redness, loss of ROM, or weakness Neurological: No reported episodes of acute onset apraxia, aphasia, dysarthria, agnosia, amnesia, paralysis, loss of coordination, or loss of consciousness  Allergies  Mr. Hanf is allergic to bee venom.  PFSH  Drug: Mr. Nikolai  reports that he does not use drugs. Alcohol:  reports that he drinks alcohol. Tobacco:  reports that he has never  smoked. He has never used smokeless tobacco. Medical:  has a past medical history of Anginal pain (HCC), DJD (degenerative joint disease), GERD (gastroesophageal reflux disease), H/O degenerative disc disease, Rhinitis, and Tinea. Surgical: Mr. Roher  has a past surgical history that includes Vasectomy; Vasectomy; and Esophagogastroduodenoscopy (egd) with propofol (N/A, 12/01/2017). Family: family history includes Diabetes in his mother; Healthy in his father.  Constitutional Exam  General appearance: Well nourished, well developed, and well hydrated. In no apparent acute distress Vitals:   10/24/18 1056 10/24/18 1057  BP:  (!) 140/93  Pulse: 92   Resp: 16   Temp: 98.2 F (36.8 C)   SpO2: 99%   Weight: 215 lb (97.5 kg)   Height: 5\' 11"  (1.803 m)    BMI Assessment: Estimated body mass index is 29.99 kg/m as calculated from the following:   Height as of this encounter: 5\' 11"  (1.803 m).   Weight as of this encounter: 215 lb (97.5 kg).  BMI interpretation table: BMI level Category Range association with higher incidence of chronic pain  <18 kg/m2 Underweight   18.5-24.9 kg/m2 Ideal body weight   25-29.9 kg/m2 Overweight Increased incidence by 20%  30-34.9 kg/m2 Obese (Class I) Increased incidence by 68%  35-39.9 kg/m2 Severe obesity (Class II) Increased incidence by 136%  >40 kg/m2 Extreme obesity (Class III) Increased incidence by 254%   Patient's current BMI Ideal Body weight  Body mass index is 29.99 kg/m. Ideal body weight: 75.3 kg (166 lb 0.1 oz) Adjusted ideal body weight: 84.2 kg (185 lb 9.7 oz)   BMI Readings from Last 4 Encounters:  10/24/18 29.99 kg/m  10/08/18 29.99 kg/m  10/03/18 29.99 kg/m  12/01/17 30.68 kg/m   Wt Readings from Last 4 Encounters:  10/24/18 215 lb (97.5 kg)  10/08/18 215 lb (97.5 kg)  10/03/18 215 lb (97.5 kg)  12/01/17 220 lb (99.8 kg)  Psych/Mental status: Alert, oriented x 3 (person, place, & time)       Eyes: PERLA Respiratory: No  evidence of acute respiratory distress  Cervical Spine Area Exam  Skin & Axial Inspection: No masses, redness, edema, swelling, or associated skin lesions Alignment: Symmetrical Functional ROM: Decreased ROM, to the left, pain restricted Stability: No instability detected Muscle Tone/Strength: Functionally intact. No obvious neuro-muscular anomalies detected. Sensory (Neurological): Dermatomal pain pattern and musculoskeletal Palpation: Tender             Positive Spurling's on the left Upper Extremity (UE) Exam    Side: Right upper extremity  Side: Left upper extremity  Skin & Extremity Inspection: Skin color, temperature, and hair growth are WNL. No peripheral edema or cyanosis. No masses, redness, swelling, asymmetry, or associated  skin lesions. No contractures.  Skin & Extremity Inspection: Skin color, temperature, and hair growth are WNL. No peripheral edema or cyanosis. No masses, redness, swelling, asymmetry, or associated skin lesions. No contractures.  Functional ROM: Unrestricted ROM          Functional ROM: Decreased ROM for shoulder and elbow  Muscle Tone/Strength: Functionally intact. No obvious neuro-muscular anomalies detected.  Muscle Tone/Strength: Functionally intact. No obvious neuro-muscular anomalies detected.  Sensory (Neurological): Unimpaired          Sensory (Neurological): Dermatomal pain pattern          Palpation: No palpable anomalies              Palpation: No palpable anomalies              Provocative Test(s):  Phalen's test: deferred Tinel's test: deferred Apley's scratch test (touch opposite shoulder):  Action 1 (Across chest): deferred Action 2 (Overhead): deferred Action 3 (LB reach): deferred   Provocative Test(s):  Phalen's test: deferred Tinel's test: deferred Apley's scratch test (touch opposite shoulder):  Action 1 (Across chest): Decreased ROM Action 2 (Overhead): Decreased ROM Action 3 (LB reach): Decreased ROM    Thoracic Spine Area Exam   Skin & Axial Inspection: No masses, redness, or swelling Alignment: Symmetrical Functional ROM: Unrestricted ROM Stability: No instability detected Muscle Tone/Strength: Functionally intact. No obvious neuro-muscular anomalies detected. Sensory (Neurological): Unimpaired Muscle strength & Tone: No palpable anomalies  Lumbar Spine Area Exam  Skin & Axial Inspection: No masses, redness, or swelling Alignment: Symmetrical Functional ROM: Unrestricted ROM       Stability: No instability detected Muscle Tone/Strength: Functionally intact. No obvious neuro-muscular anomalies detected. Sensory (Neurological): Unimpaired Palpation: No palpable anomalies       Provocative Tests: Hyperextension/rotation test: deferred today       Lumbar quadrant test (Kemp's test): deferred today       Lateral bending test: deferred today       Patrick's Maneuver: deferred today                   FABER* test: deferred today                   S-I anterior distraction/compression test: deferred today         S-I lateral compression test: deferred today         S-I Thigh-thrust test: deferred today         S-I Gaenslen's test: deferred today         *(Flexion, ABduction and External Rotation)  Gait & Posture Assessment  Ambulation: Unassisted Gait: Relatively normal for age and body habitus Posture: WNL   Lower Extremity Exam    Side: Right lower extremity  Side: Left lower extremity  Stability: No instability observed          Stability: No instability observed          Skin & Extremity Inspection: Skin color, temperature, and hair growth are WNL. No peripheral edema or cyanosis. No masses, redness, swelling, asymmetry, or associated skin lesions. No contractures.  Skin & Extremity Inspection: Skin color, temperature, and hair growth are WNL. No peripheral edema or cyanosis. No masses, redness, swelling, asymmetry, or associated skin lesions. No contractures.  Functional ROM: Unrestricted ROM                   Functional ROM: Unrestricted ROM  Muscle Tone/Strength: Functionally intact. No obvious neuro-muscular anomalies detected.  Muscle Tone/Strength: Functionally intact. No obvious neuro-muscular anomalies detected.  Sensory (Neurological): Unimpaired        Sensory (Neurological): Unimpaired        DTR: Patellar: deferred today Achilles: deferred today Plantar: deferred today  DTR: Patellar: deferred today Achilles: deferred today Plantar: deferred today  Palpation: No palpable anomalies  Palpation: No palpable anomalies   Assessment  Primary Diagnosis & Pertinent Problem List: The primary encounter diagnosis was Cervical radiculopathy (LEFT C6,7). Diagnoses of DDD (degenerative disc disease), cervical, Cervicalgia, and Chronic pain syndrome were also pertinent to this visit.  Status Diagnosis  Persistent Persistent Persistent 1. Cervical radiculopathy (LEFT C6,7)   2. DDD (degenerative disc disease), cervical   3. Cervicalgia   4. 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  Patient is status post left C7-T1 ESI.  Patient states that for the first 4 to 5 days, he had complete relief of his left radicular pain.  Thereafter, he has had return of his left lateral trapezius and shoulder pain but continues to endorse improvement of his left forearm and hand pain.  Patient has multilevel cervical disc herniations as well as foraminal stenosis at bilateral cervical levels.  Given his worsening pain along his left trap and left shoulder we discussed intramuscular Norflex and Toradol as below.  Patient's kidney function within normal limits.  I also recommended the patient start gabapentin for his neuropathic/radicular pain.  If his symptoms are not better in approximately 10 to 14 days, patient can fill  his Medrol Dosepak.  Patient will follow-up with me in 4 weeks.  Future considerations: Repeat left C7-T1 ESI, added volume, add steroid.  Plan of Care  Pharmacotherapy (Medications Ordered): Meds ordered this encounter  Medications  . orphenadrine (NORFLEX) injection 60 mg  . ketorolac (TORADOL) injection 60 mg  . gabapentin (NEURONTIN) 300 MG capsule    Sig: 300 mg qhs x 2 weeks then increase to BID    Dispense:  90 capsule    Refill:  2  . methylPREDNISolone (MEDROL) 4 MG TBPK tablet    Sig: Follow package instructions.    Dispense:  21 tablet    Refill:  0    Do not add to the "Automatic Refill" notification system.   Time Note: Greater than 50% of the 25 minute(s) of face-to-face time spent with [name], was spent in counseling/coordination of care regarding: Mr. Norris CrossWilliams's primary cause of pain, the treatment plan, treatment alternatives, the risks and possible complications of proposed treatment, medication side effects, the results, interpretation and significance of  his recent diagnostic interventional treatment(s), the appropriate use of his medications, realistic expectations and the goals of pain management (increased in functionality).  Provider-requested follow-up: Return in about 4 weeks (around 11/21/2018) for Medication Management.  Future Appointments  Date Time Provider Department Center  11/22/2018  9:30 AM Edward JollyLateef, Rene Sizelove, MD [facility] None    Primary Care Physician: Myrene BuddyGauger, Sarah Kathryn, NP [facility] Note by: Edward JollyBilal Kebron Pulse, M.D Date: 10/24/2018; Time: 1:20 PM  Patient Instructions  You have been given a steroid dose pack prescription today.    Gabapentin capsules or tablets What is this medicine? GABAPENTIN (GA ba pen tin) is used to control partial seizures in adults with epilepsy. It is also  used to treat certain types of nerve pain. This medicine may be used for other purposes; ask your health care provider or  pharmacist if you have questions. COMMON BRAND NAME(S): Active-PAC with Gabapentin, Gabarone, Neurontin What should I tell my health care provider before I take this medicine? They need to know if you have any of these conditions: -kidney disease -suicidal thoughts, plans, or attempt; a previous suicide attempt by you or a family member -an unusual or allergic reaction to gabapentin, other medicines, foods, dyes, or preservatives -pregnant or trying to get pregnant -breast-feeding How should I use this medicine? Take this medicine by mouth with a glass of water. Follow the directions on the prescription label. You can take it with or without food. If it upsets your stomach, take it with food.Take your medicine at regular intervals. Do not take it more often than directed. Do not stop taking except on your doctor's advice. If you are directed to break the 600 or 800 mg tablets in half as part of your dose, the extra half tablet should be used for the next dose. If you have not used the extra half tablet within 28 days, it should be thrown away. A special MedGuide will be given to you by the pharmacist with each prescription and refill. Be sure to read this information carefully each time. Talk to your pediatrician regarding the use of this medicine in children. Special care may be needed. Overdosage: If you think you have taken too much of this medicine contact a poison control center or emergency room at once. NOTE: This medicine is only for you. Do not share this medicine with others. What if I miss a dose? If you miss a dose, take it as soon as you can. If it is almost time for your next dose, take only that dose. Do not take double or extra doses. What may interact with this medicine? Do not take this medicine with any of the following medications: -other gabapentin products This medicine may also interact with the following medications: -alcohol -antacids -antihistamines for allergy,  cough and cold -certain medicines for anxiety or sleep -certain medicines for depression or psychotic disturbances -homatropine; hydrocodone -naproxen -narcotic medicines (opiates) for pain -phenothiazines like chlorpromazine, mesoridazine, prochlorperazine, thioridazine This list may not describe all possible interactions. Give your health care provider a list of all the medicines, herbs, non-prescription drugs, or dietary supplements you use. Also tell them if you smoke, drink alcohol, or use illegal drugs. Some items may interact with your medicine. What should I watch for while using this medicine? Visit your doctor or health care professional for regular checks on your progress. You may want to keep a record at home of how you feel your condition is responding to treatment. You may want to share this information with your doctor or health care professional at each visit. You should contact your doctor or health care professional if your seizures get worse or if you have any new types of seizures. Do not stop taking this medicine or any of your seizure medicines unless instructed by your doctor or health care professional. Stopping your medicine suddenly can increase your seizures or their severity. Wear a medical identification bracelet or chain if you are taking this medicine for seizures, and carry a card that lists all your medications. You may get drowsy, dizzy, or have blurred vision. Do not drive, use machinery, or do anything that needs mental alertness until you know how this medicine affects you. To  reduce dizzy or fainting spells, do not sit or stand up quickly, especially if you are an older patient. Alcohol can increase drowsiness and dizziness. Avoid alcoholic drinks. Your mouth may get dry. Chewing sugarless gum or sucking hard candy, and drinking plenty of water will help. The use of this medicine may increase the chance of suicidal thoughts or actions. Pay special attention to how  you are responding while on this medicine. Any worsening of mood, or thoughts of suicide or dying should be reported to your health care professional right away. Women who become pregnant while using this medicine may enroll in the [place] Antiepileptic Drug Pregnancy Registry by calling [phone]. This registry collects information about the safety of antiepileptic drug use during pregnancy. What side effects may I notice from receiving this medicine? Side effects that you should report to your doctor or health care professional as soon as possible: -allergic reactions like skin rash, itching or hives, swelling of the face, lips, or tongue -worsening of mood, thoughts or actions of suicide or dying Side effects that usually do not require medical attention (report to your doctor or health care professional if they continue or are bothersome): -constipation -difficulty walking or controlling muscle movements -dizziness -nausea -slurred speech -tiredness -tremors -weight gain This list may not describe all possible side effects. Call your doctor for medical advice about side effects. You may report side effects to FDA at 1-800-FDA-1088. Where should I keep my medicine? Keep out of reach of children. This medicine may cause accidental overdose and death if it taken by other adults, children, or pets. Mix any unused medicine with a substance like cat litter or coffee grounds. Then throw the medicine away in a sealed container like a sealed bag or a coffee can with a lid. Do not use the medicine after the expiration date. Store at room temperature between 15 and 30 degrees C (59 and 86 degrees F). NOTE: This sheet is a summary. It may not cover all possible information. If you have questions about this medicine, talk to your doctor, pharmacist, or health care provider.  2018 Elsevier/Gold Standard (2013-12-27 15:26:50)

## 2018-10-24 NOTE — Progress Notes (Signed)
Safety precautions to be maintained throughout the outpatient stay will include: orient to surroundings, keep bed in low position, maintain call bell within reach at all times, provide assistance with transfer out of bed and ambulation.  

## 2018-10-24 NOTE — Patient Instructions (Signed)
You have been given a steroid dose pack prescription today.    Gabapentin capsules or tablets What is this medicine? GABAPENTIN (GA ba pen tin) is used to control partial seizures in adults with epilepsy. It is also used to treat certain types of nerve pain. This medicine may be used for other purposes; ask your health care provider or pharmacist if you have questions. COMMON BRAND NAME(S): Active-PAC with Gabapentin, Gabarone, Neurontin What should I tell my health care provider before I take this medicine? They need to know if you have any of these conditions: -kidney disease -suicidal thoughts, plans, or attempt; a previous suicide attempt by you or a family member -an unusual or allergic reaction to gabapentin, other medicines, foods, dyes, or preservatives -pregnant or trying to get pregnant -breast-feeding How should I use this medicine? Take this medicine by mouth with a glass of water. Follow the directions on the prescription label. You can take it with or without food. If it upsets your stomach, take it with food.Take your medicine at regular intervals. Do not take it more often than directed. Do not stop taking except on your doctor's advice. If you are directed to break the 600 or 800 mg tablets in half as part of your dose, the extra half tablet should be used for the next dose. If you have not used the extra half tablet within 28 days, it should be thrown away. A special MedGuide will be given to you by the pharmacist with each prescription and refill. Be sure to read this information carefully each time. Talk to your pediatrician regarding the use of this medicine in children. Special care may be needed. Overdosage: If you think you have taken too much of this medicine contact a poison control center or emergency room at once. NOTE: This medicine is only for you. Do not share this medicine with others. What if I miss a dose? If you miss a dose, take it as soon as you can. If it  is almost time for your next dose, take only that dose. Do not take double or extra doses. What may interact with this medicine? Do not take this medicine with any of the following medications: -other gabapentin products This medicine may also interact with the following medications: -alcohol -antacids -antihistamines for allergy, cough and cold -certain medicines for anxiety or sleep -certain medicines for depression or psychotic disturbances -homatropine; hydrocodone -naproxen -narcotic medicines (opiates) for pain -phenothiazines like chlorpromazine, mesoridazine, prochlorperazine, thioridazine This list may not describe all possible interactions. Give your health care provider a list of all the medicines, herbs, non-prescription drugs, or dietary supplements you use. Also tell them if you smoke, drink alcohol, or use illegal drugs. Some items may interact with your medicine. What should I watch for while using this medicine? Visit your doctor or health care professional for regular checks on your progress. You may want to keep a record at home of how you feel your condition is responding to treatment. You may want to share this information with your doctor or health care professional at each visit. You should contact your doctor or health care professional if your seizures get worse or if you have any new types of seizures. Do not stop taking this medicine or any of your seizure medicines unless instructed by your doctor or health care professional. Stopping your medicine suddenly can increase your seizures or their severity. Wear a medical identification bracelet or chain if you are taking this medicine for seizures, and  carry a card that lists all your medications. You may get drowsy, dizzy, or have blurred vision. Do not drive, use machinery, or do anything that needs mental alertness until you know how this medicine affects you. To reduce dizzy or fainting spells, do not sit or stand up  quickly, especially if you are an older patient. Alcohol can increase drowsiness and dizziness. Avoid alcoholic drinks. Your mouth may get dry. Chewing sugarless gum or sucking hard candy, and drinking plenty of water will help. The use of this medicine may increase the chance of suicidal thoughts or actions. Pay special attention to how you are responding while on this medicine. Any worsening of mood, or thoughts of suicide or dying should be reported to your health care professional right away. Women who become pregnant while using this medicine may enroll in the [place] American Antiepileptic Drug Pregnancy Registry by calling [phone]. This registry collects information about the safety of antiepileptic drug use during pregnancy. What side effects may I notice from receiving this medicine? Side effects that you should report to your doctor or health care professional as soon as possible: -allergic reactions like skin rash, itching or hives, swelling of the face, lips, or tongue -worsening of mood, thoughts or actions of suicide or dying Side effects that usually do not require medical attention (report to your doctor or health care professional if they continue or are bothersome): -constipation -difficulty walking or controlling muscle movements -dizziness -nausea -slurred speech -tiredness -tremors -weight gain This list may not describe all possible side effects. Call your doctor for medical advice about side effects. You may report side effects to FDA at 1-800-FDA-1088. Where should I keep my medicine? Keep out of reach of children. This medicine may cause accidental overdose and death if it taken by other adults, children, or pets. Mix any unused medicine with a substance like cat litter or coffee grounds. Then throw the medicine away in a sealed container like a sealed bag or a coffee can with a lid. Do not use the medicine after the expiration date. Store at room temperature  between 15 and 30 degrees C (59 and 86 degrees F). NOTE: This sheet is a summary. It may not cover all possible information. If you have questions about this medicine, talk to your doctor, pharmacist, or health care provider.  2018 Elsevier/Gold Standard (2013-12-27 15:26:50)

## 2018-11-22 ENCOUNTER — Encounter: Payer: Self-pay | Admitting: Student in an Organized Health Care Education/Training Program

## 2018-11-22 ENCOUNTER — Other Ambulatory Visit: Payer: Self-pay

## 2018-11-22 ENCOUNTER — Ambulatory Visit
Payer: BC Managed Care – PPO | Attending: Student in an Organized Health Care Education/Training Program | Admitting: Student in an Organized Health Care Education/Training Program

## 2018-11-22 VITALS — BP 144/88 | HR 100 | Temp 98.0°F | Resp 16 | Ht 71.0 in | Wt 215.0 lb

## 2018-11-22 DIAGNOSIS — M5412 Radiculopathy, cervical region: Secondary | ICD-10-CM

## 2018-11-22 DIAGNOSIS — G894 Chronic pain syndrome: Secondary | ICD-10-CM

## 2018-11-22 DIAGNOSIS — M542 Cervicalgia: Secondary | ICD-10-CM | POA: Insufficient documentation

## 2018-11-22 DIAGNOSIS — M5030 Other cervical disc degeneration, unspecified cervical region: Secondary | ICD-10-CM | POA: Insufficient documentation

## 2018-11-22 MED ORDER — METHYLPREDNISOLONE 4 MG PO TBPK: ORAL_TABLET | ORAL | 0 refills | Status: AC

## 2018-11-22 MED ORDER — DULOXETINE HCL 30 MG PO CPEP: ORAL_CAPSULE | ORAL | 2 refills | Status: DC

## 2018-11-22 NOTE — Progress Notes (Signed)
Safety precautions to be maintained throughout the outpatient stay will include: orient to surroundings, keep bed in low position, maintain call bell within reach at all times, provide assistance with transfer out of bed and ambulation.  

## 2018-11-22 NOTE — Patient Instructions (Addendum)
Moderate Conscious Sedation, Adult Sedation is the use of medicines to promote relaxation and relieve discomfort and anxiety. Moderate conscious sedation is a type of sedation. Under moderate conscious sedation, you are less alert than normal, but you are still able to respond to instructions, touch, or both. Moderate conscious sedation is used during short medical and dental procedures. It is milder than deep sedation, which is a type of sedation under which you cannot be easily woken up. It is also milder than general anesthesia, which is the use of medicines to make you unconscious. Moderate conscious sedation allows you to return to your regular activities sooner. Tell a health care provider about:  Any allergies you have.  All medicines you are taking, including vitamins, herbs, eye drops, creams, and over-the-counter medicines.  Use of steroids (by mouth or creams).  Any problems you or family members have had with sedatives and anesthetic medicines.  Any blood disorders you have.  Any surgeries you have had.  Any medical conditions you have, such as sleep apnea.  Whether you are pregnant or may be pregnant.  Any use of cigarettes, alcohol, marijuana, or street drugs. What are the risks? Generally, this is a safe procedure. However, problems may occur, including:  Getting too much medicine (oversedation).  Nausea.  Allergic reaction to medicines.  Trouble breathing. If this happens, a breathing tube may be used to help with breathing. It will be removed when you are awake and breathing on your own.  Heart trouble.  Lung trouble. What happens before the procedure? Staying hydrated Follow instructions from your health care provider about hydration, which may include:  Up to 2 hours before the procedure - you may continue to drink clear liquids, such as water, clear fruit juice, black coffee, and plain tea. Eating and drinking restrictions Follow instructions from your  health care provider about eating and drinking, which may include:  8 hours before the procedure - stop eating heavy meals or foods such as meat, fried foods, or fatty foods.  6 hours before the procedure - stop eating light meals or foods, such as toast or cereal.  6 hours before the procedure - stop drinking milk or drinks that contain milk.  2 hours before the procedure - stop drinking clear liquids. Medicine Ask your health care provider about:  Changing or stopping your regular medicines. This is especially important if you are taking diabetes medicines or blood thinners.  Taking medicines such as aspirin and ibuprofen. These medicines can thin your blood. Do not take these medicines before your procedure if your health care provider instructs you not to.  Tests and exams  You will have a physical exam.  You may have blood tests done to show: ? How well your kidneys and liver are working. ? How well your blood can clot. General instructions  Plan to have someone take you home from the hospital or clinic.  If you will be going home right after the procedure, plan to have someone with you for 24 hours. What happens during the procedure?  An IV tube will be inserted into one of your veins.  Medicine to help you relax (sedative) will be given through the IV tube.  The medical or dental procedure will be performed. What happens after the procedure?  Your blood pressure, heart rate, breathing rate, and blood oxygen level will be monitored often until the medicines you were given have worn off.  Do not drive for 24 hours. This information is not   intended to replace advice given to you by your health care provider. Make sure you discuss any questions you have with your health care provider. Document Released: 07/26/2001 Document Revised: 04/05/2016 Document Reviewed: 02/20/2016 Elsevier Interactive Patient Education  2019 [place]  What are the risk, side effects and possible complications? Generally speaking, most procedures are safe.  However, with any procedure there are risks, side effects, and the possibility of complications.  The risks and complications are dependent upon the sites that are lesioned, or the type of nerve block to be performed.  The closer the procedure is to the spine, the more serious the risks are.  Great care is taken when placing the radio frequency needles, block needles or lesioning probes, but sometimes complications can occur. 1. Infection: Any time there is an injection through the skin, there is a risk of infection.  This is why sterile conditions are used for these blocks.  There are four possible types of infection. 1. Localized skin infection. 2. Central Nervous System Infection-This can be in the form of Meningitis, which can be deadly. 3. Epidural Infections-This can be in the form of an epidural abscess, which can cause pressure inside of the spine, causing compression of the spinal cord with subsequent paralysis. This would require an emergency surgery to decompress, and there are no guarantees that the patient would recover from the paralysis. 4. Discitis-This is an infection of the intervertebral discs.  It occurs in about 1% of discography procedures.  It is difficult to treat and it may lead to surgery.        2. Pain: the needles have to go through skin and soft tissues, will cause soreness.       3. Damage to internal structures:  The nerves to be lesioned may be near blood vessels or    other nerves which can be potentially damaged.       4. Bleeding: Bleeding is more common if the patient is taking blood thinners such as  aspirin, Coumadin, Ticiid, Plavix, etc., or if he/she have some genetic predisposition  such as hemophilia. Bleeding into the spinal canal can cause compression of the spinal  cord with subsequent paralysis.  This would require an emergency surgery  to  decompress and there are no guarantees that the patient would recover from the  paralysis.       5. Pneumothorax:  Puncturing of a lung is a possibility, every time a needle is introduced in  the area of the chest or upper back.  Pneumothorax refers to free air around the  collapsed lung(s), inside of the thoracic cavity (chest cavity).  Another two possible  complications related to a similar event would include: Hemothorax and Chylothorax.   These are variations of the Pneumothorax, where instead of air around the collapsed  lung(s), you may have blood or chyle, respectively.       6. Spinal headaches: They may occur with any procedures in the area of the spine.       7. Persistent CSF (Cerebro-Spinal Fluid) leakage: This is a rare problem, but may occur  with prolonged intrathecal or epidural catheters either due to the formation of a fistulous  track or a dural tear.       8. Nerve damage: By working so close to the spinal cord, there is always a possibility of  nerve damage, which could be as serious as a permanent spinal cord injury with  paralysis.  9. Death:  Although rare, severe deadly allergic reactions known as "Anaphylactic  reaction" can occur to any of the medications used.      10. Worsening of the symptoms:  We can always make thing worse.  What are the chances of something like this happening? Chances of any of this occuring are extremely low.  By statistics, you have more of a chance of getting killed in a motor vehicle accident: while driving to the hospital than any of the above occurring .  Nevertheless, you should be aware that they are possibilities.  In general, it is similar to taking a shower.  Everybody knows that you can slip, hit your head and get killed.  Does that mean that you should not shower again?  Nevertheless always keep in mind that statistics do not mean anything if you happen to be on the wrong side of them.  Even if a procedure has a 1 (one) in a 1,000,000  (million) chance of going wrong, it you happen to be that one..Also, keep in mind that by statistics, you have more of a chance of having something go wrong when taking medications.  Who should not have this procedure? If you are on a blood thinning medication (e.g. Coumadin, Plavix, see list of "Blood Thinners"), or if you have an active infection going on, you should not have the procedure.  If you are taking any blood thinners, please inform your physician.  How should I prepare for this procedure?  Do not eat or drink anything at least six hours prior to the procedure.  Bring a driver with you .  It cannot be a taxi.  Come accompanied by an adult that can drive you back, and that is strong enough to help you if your legs get weak or numb from the local anesthetic.  Take all of your medicines the morning of the procedure with just enough water to swallow them.  If you have diabetes, make sure that you are scheduled to have your procedure done first thing in the morning, whenever possible.  If you have diabetes, take only half of your insulin dose and notify our nurse that you have done so as soon as you arrive at the clinic.  If you are diabetic, but only take blood sugar pills (oral hypoglycemic), then do not take them on the morning of your procedure.  You may take them after you have had the procedure.  Do not take aspirin or any aspirin-containing medications, at least eleven (11) days prior to the procedure.  They may prolong bleeding.  Wear loose fitting clothing that may be easy to take off and that you would not mind if it got stained with Betadine or blood.  Do not wear any jewelry or perfume  Remove any nail coloring.  It will interfere with some of our monitoring equipment.  NOTE: Remember that this is not meant to be interpreted as a complete list of all possible complications.  Unforeseen problems may occur.  BLOOD THINNERS The following drugs contain aspirin or other  products, which can cause increased bleeding during surgery and should not be taken for 2 weeks prior to and 1 week after surgery.  If you should need take something for relief of minor pain, you may take acetaminophen which is found in Tylenol,m Datril, Anacin-3 and Panadol. It is not blood thinner. The products listed below are.  Do not take any of the products listed below in addition to any listed on  your instruction sheet.  A.P.C or A.P.C with Codeine Codeine Phosphate Capsules #3 Ibuprofen Ridaura  ABC compound Congesprin Imuran rimadil  Advil Cope Indocin Robaxisal  Alka-Seltzer Effervescent Pain Reliever and Antacid Coricidin or Coricidin-D  Indomethacin Rufen  Alka-Seltzer plus Cold Medicine Cosprin Ketoprofen S-A-C Tablets  Anacin Analgesic Tablets or Capsules Coumadin Korlgesic Salflex  Anacin Extra Strength Analgesic tablets or capsules CP-2 Tablets Lanoril Salicylate  Anaprox Cuprimine Capsules Levenox Salocol  Anexsia-D Dalteparin Magan Salsalate  Anodynos Darvon compound Magnesium Salicylate Sine-off  Ansaid Dasin Capsules Magsal Sodium Salicylate  Anturane Depen Capsules Marnal Soma  APF Arthritis pain formula Dewitt's Pills Measurin Stanback  Argesic Dia-Gesic Meclofenamic Sulfinpyrazone  Arthritis Bayer Timed Release Aspirin Diclofenac Meclomen Sulindac  Arthritis pain formula Anacin Dicumarol Medipren Supac  Analgesic (Safety coated) Arthralgen Diffunasal Mefanamic Suprofen  Arthritis Strength Bufferin Dihydrocodeine Mepro Compound Suprol  Arthropan liquid Dopirydamole Methcarbomol with Aspirin Synalgos  ASA tablets/Enseals Disalcid Micrainin Tagament  Ascriptin Doan's Midol Talwin  Ascriptin A/D Dolene Mobidin Tanderil  Ascriptin Extra Strength Dolobid Moblgesic Ticlid  Ascriptin with Codeine Doloprin or Doloprin with Codeine Momentum Tolectin  Asperbuf Duoprin Mono-gesic Trendar  Aspergum Duradyne Motrin or Motrin IB Triminicin  Aspirin plain, buffered or enteric  coated Durasal Myochrisine Trigesic  Aspirin Suppositories Easprin Nalfon Trillsate  Aspirin with Codeine Ecotrin Regular or Extra Strength Naprosyn Uracel  Atromid-S Efficin Naproxen Ursinus  Auranofin Capsules Elmiron Neocylate Vanquish  Axotal Emagrin Norgesic Verin  Azathioprine Empirin or Empirin with Codeine Normiflo Vitamin E  Azolid Emprazil Nuprin Voltaren  Bayer Aspirin plain, buffered or children's or timed BC Tablets or powders Encaprin Orgaran Warfarin Sodium  Buff-a-Comp Enoxaparin Orudis Zorpin  Buff-a-Comp with Codeine Equegesic Os-Cal-Gesic   Buffaprin Excedrin plain, buffered or Extra Strength Oxalid   Bufferin Arthritis Strength Feldene Oxphenbutazone   Bufferin plain or Extra Strength Feldene Capsules Oxycodone with Aspirin   Bufferin with Codeine Fenoprofen Fenoprofen Pabalate or Pabalate-SF   Buffets II Flogesic Panagesic   Buffinol plain or Extra Strength Florinal or Florinal with Codeine Panwarfarin   Buf-Tabs Flurbiprofen Penicillamine   Butalbital Compound Four-way cold tablets Penicillin   Butazolidin Fragmin Pepto-Bismol   Carbenicillin Geminisyn Percodan   Carna Arthritis Reliever Geopen Persantine   Carprofen Gold's salt Persistin   Chloramphenicol Goody's Phenylbutazone   Chloromycetin Haltrain Piroxlcam   Clmetidine heparin Plaquenil   Cllnoril Hyco-pap Ponstel   Clofibrate Hydroxy chloroquine Propoxyphen         Before stopping any of these medications, be sure to consult the physician who ordered them.  Some, such as Coumadin (Warfarin) are ordered to prevent or treat serious conditions such as "deep thrombosis", "pumonary embolisms", and other heart problems.  The amount of time that you may need off of the medication may also vary with the medication and the reason for which you were taking it.  If you are taking any of these medications, please make sure you notify your pain physician before you undergo any procedures.       Methilprednisolone  and cymbalta have been escribed to your pharmacy.  Epidural Steroid Injection Patient Information  Description: The epidural space surrounds the nerves as they exit the spinal cord.  In some patients, the nerves can be compressed and inflamed by a bulging disc or a tight spinal canal (spinal stenosis).  By injecting steroids into the epidural space, we can bring irritated nerves into direct contact with a potentially helpful medication.  These steroids act directly on the irritated nerves and can reduce swelling and  inflammation which often leads to decreased pain.  Epidural steroids may be injected anywhere along the spine and from the neck to the low back depending upon the location of your pain.   After numbing the skin with local anesthetic (like Novocaine), a small needle is passed into the epidural space slowly.  You may experience a sensation of pressure while this is being done.  The entire block usually last less than 10 minutes.  Conditions which may be treated by epidural steroids:   Low back and leg pain  Neck and arm pain  Spinal stenosis  Post-laminectomy syndrome  Herpes zoster (shingles) pain  Pain from compression fractures  Preparation for the injection:  1. Do not eat any solid food or dairy products within 8 hours of your appointment.  2. You may drink clear liquids up to 3 hours before appointment.  Clear liquids include water, black coffee, juice or soda.  No milk or cream please. 3. You may take your regular medication, including pain medications, with a sip of water before your appointment  Diabetics should hold regular insulin (if taken separately) and take 1/2 normal NPH dos the morning of the procedure.  Carry some sugar containing items with you to your appointment. 4. A driver must accompany you and be prepared to drive you home after your procedure.  5. Bring all your current medications with your. 6. An IV may be inserted and sedation may be given at the  discretion of the physician.   7. A blood pressure cuff, EKG and other monitors will often be applied during the procedure.  Some patients may need to have extra oxygen administered for a short period. 8. You will be asked to provide medical information, including your allergies, prior to the procedure.  We must know immediately if you are taking blood thinners (like Coumadin/Warfarin)  Or if you are allergic to IV iodine contrast (dye). We must know if you could possible be pregnant.  Possible side-effects:  Bleeding from needle site  Infection (rare, may require surgery)  Nerve injury (rare)  Numbness & tingling (temporary)  Difficulty urinating (rare, temporary)  Spinal headache ( a headache worse with upright posture)  Light -headedness (temporary)  Pain at injection site (several days)  Decreased blood pressure (temporary)  Weakness in arm/leg (temporary)  Pressure sensation in back/neck (temporary)  Call if you experience:  Fever/chills associated with headache or increased back/neck pain.  Headache worsened by an upright position.  New onset weakness or numbness of an extremity below the injection site  Hives or difficulty breathing (go to the emergency room)  Inflammation or drainage at the infection site  Severe back/neck pain  Any new symptoms which are concerning to you  Please note:  Although the local anesthetic injected can often make your back or neck feel good for several hours after the injection, the pain will likely return.  It takes 3-7 days for steroids to work in the epidural space.  You may not notice any pain relief for at least that one week.  If effective, we will often do a series of three injections spaced 3-6 weeks apart to maximally decrease your pain.  After the initial series, we generally will wait several months before considering a repeat injection of the same type.  If you have any questions, please call [phone] [facility]  [facility]

## 2018-11-22 NOTE — Progress Notes (Signed)
Patient's Name: Kyle Fields  MRN: [id number]  Referring Provider: Sallee Lange, *  DOB: 09/27/1971  PCP: Sallee Lange, NP  DOS: 11/22/2018  Note by: Gillis Santa, MD  Service setting: Ambulatory outpatient  Specialty: Interventional Pain Management  Location: [facility] (AMB) Pain Management Facility    Patient type: Established   Primary Reason(s) for Visit: Evaluation of chronic illnesses with exacerbation, or progression (Level of risk: moderate) CC: Arm Pain (left)  HPI  Mr. Detweiler is a 48 y.o. year old, male patient, who comes today for a follow-up evaluation. He has Cervical radiculopathy (LEFT C6,7); DDD (degenerative disc disease), cervical; Cervicalgia; and Chronic pain syndrome on their problem list. Mr. Blanchfield was last seen on 10/24/2018. His primarily concern today is the Arm Pain (left)  Pain Assessment: Location: Left Arm Radiating: down back of left arm to fingers Onset: More than a month ago Duration: Chronic pain Quality: Burning, Constant, Nagging, Tingling(more severe in mornings; arm pain is burning and nagging; fingers tingle constantly) Severity: 4 /10 (subjective, self-reported pain score)  Note: Reported level is compatible with observation.                         When using our objective Pain Scale, levels between 6 and 10/10 are said to belong in an emergency room, as it progressively worsens from a 6/10, described as severely limiting, requiring emergency care not usually available at an outpatient pain management facility. At a 6/10 level, communication becomes difficult and requires great effort. Assistance to reach the emergency department may be required. Facial flushing and profuse sweating along with potentially dangerous increases in heart rate and blood pressure will be evident. Effect on ADL: "limits daily activities, tho not as much as it used to" Timing: Constant Modifying factors: procedure, gabapentin had helped BP: (!) 144/88  HR:  100  Further details on both, my assessment(s), as well as the proposed treatment plan, please see below.  Patient follows up today continuing to endorse left neck pain that radiates down into his left arm and forearm secondary to cervical radiculopathy.  Patient has tried gabapentin and states that he does not like how it makes him feel although it was helping out somewhat.  He endorses symptoms of depression and low energy.  Patient became very tearful during the encounter stating that he is tired of the pain and wanted to go away.  Patient denies having trying Medrol Dosepak that I recommended at his last visit.  He states that he was unable to get it filled since he was out of town in [place].  Laboratory Chemistry  Inflammation Markers (CRP: Acute Phase) (ESR: Chronic Phase) No results found for: CRP, ESRSEDRATE, LATICACIDVEN                       Rheumatology Markers No results found for: RF, ANA, LABURIC, URICUR, LYMEIGGIGMAB, LYMEABIGMQN, HLAB27                      Renal Function Markers No results found for: BUN, CREATININE, BCR, GFRAA, GFRNONAA, LABVMA, EPIRU, EPINEPH24HUR, NOREPRU, NOREPI24HUR, DOPARU, [id number]                           Hepatic Function Markers No results found for: AST, ALT, ALBUMIN, ALKPHOS, HCVAB, AMYLASE, LIPASE, AMMONIA  Electrolytes No results found for: NA, K, CL, CALCIUM, MG, PHOS                      Neuropathy Markers No results found for: VITAMINB12, FOLATE, HGBA1C, HIV                      CNS Tests No results found for: COLORCSF, APPEARCSF, RBCCOUNTCSF, WBCCSF, POLYSCSF, LYMPHSCSF, EOSCSF, PROTEINCSF, GLUCCSF, JCVIRUS, CSFOLI, IGGCSF                      Bone Pathology Markers No results found for: VD25OH, [id number], [id number], [id number], 25OHVITD1, 25OHVITD2, 25OHVITD3, TESTOFREE, TESTOSTERONE                       Coagulation Parameters No results found for: INR, LABPROT, APTT, PLT, DDIMER, LABHEMA, VITAMINK1                       Cardiovascular Markers No results found for: BNP, CKTOTAL, CKMB, TROPONINI, HGB, HCT                       CA Markers No results found for: CEA, CA125, LABCA2                      Note: Lab results reviewed.  Recent Diagnostic Imaging Review  Cervical Imaging: Cervical MR wo contrast:  Results for orders placed during the hospital encounter of 10/31/17  MR CERVICAL SPINE WO CONTRAST   Narrative CLINICAL DATA:  48 year old male with chronic neck pain and stiffness. Decreased range of motion. Bilateral arm and hand numbness. Weakness worse on the left.  EXAM: MRI CERVICAL SPINE WITHOUT CONTRAST  TECHNIQUE: Multiplanar, multisequence MR imaging of the cervical spine was performed. No intravenous contrast was administered.  COMPARISON:  None.  FINDINGS: Alignment: Straightening of cervical lordosis. Subtle retrolisthesis of C3 on C4 and C4 on C5. Mild anterolisthesis of C7 on T1.  Vertebrae: No marrow edema or evidence of acute osseous abnormality. Visualized bone marrow signal is within normal limits.  Cord: Spinal cord signal is within normal limits at all visualized levels.  Posterior Fossa, vertebral arteries, paraspinal tissues: Cervicomedullary junction is within normal limits. Negative visualized posterior fossa. Preserved bilateral major vascular flow voids in the neck. Negative neck soft tissues and visible right lung apex.  Disc levels:  C2-C3:  Negative.  C3-C4: Mild disc space loss with circumferential disc bulge. Broad-based posterior and right greater than left foraminal involvement with endplate spurring. Mild spinal stenosis with no spinal cord mass effect. Mild to moderate right C4 foraminal stenosis.  C4-C5: Right eccentric circumferential disc bulge and endplate spurring. Broad-based right foraminal component. Spinal stenosis with borderline to mild right hemi cord mass effect. Moderate left and severe right C5 foraminal stenosis  (series 5, image 12).  C5-C6: Circumferential disc bulge with superimposed broad-based right lateral recess to foraminal disc protrusion (series 5, image 17). Superimposed endplate spurring. Borderline to mild associated spinal stenosis, but moderate to severe right C6 neural foraminal stenosis. There is mild contralateral left foraminal stenosis.  C6-C7: Circumferential disc bulge and endplate spurring with broad-based posterior and left greater than right foraminal components. Broad-based left foraminal component of disc suspected (series 5, image 20). No spinal stenosis. Severe left and borderline to mild right C7 neural foraminal stenosis.  C7-T1: Mild anterolisthesis. Moderate right and moderate to severe left facet  hypertrophy. Associated left side degenerative subchondral cysts (series 5, images 26 and 27) which do not appear to be contributing to stenosis at this time. Moderate ligament flavum hypertrophy. Minimal disc bulge. Mild foraminal endplate spurring moderate left greater than right C8 foraminal stenosis.  No upper thoracic spinal or foraminal stenosis.  IMPRESSION: 1. With regard to the left side radiculitis the most significant levels are C6-C7 and C7-T1: - C6-C7 disc and endplate degeneration with bulky left foraminal component of disc suspected and severe left foraminal stenosis. Query left C7 radiculitis. - mild spondylolisthesis at C7-T1 with advanced facet degeneration and moderate bilateral C8 neural foraminal stenosis, greater on the left. 2. Disc and endplate degeneration M3-W4 through C5-C6 with up to mild spinal stenosis, and up to moderate/severe neural foraminal stenosis greater on the right. - Query right C5 and/or C6 radiculitis. - Mild if any spinal cord mass effect, no cord signal abnormality.   Electronically Signed   By: Genevie Ann M.D.   On: 10/31/2017 15:00     Foot Imaging: Foot-R DG Complete:  Results for orders placed during the  hospital encounter of 05/21/16  DG Foot Complete Right   Narrative CLINICAL DATA:  Lateral RIGHT foot numbness at 4th to 5th metatarsals for 2 weeks, no history of trauma or injury, history degenerative disc disease  EXAM: RIGHT FOOT COMPLETE - 3+ VIEW  COMPARISON:  None  FINDINGS: Osseous mineralization normal.  Joint spaces preserved.  No acute fracture, dislocation or bone destruction.  Soft tissues radiographically unremarkable.  IMPRESSION: Normal exam.   Electronically Signed   By: Lavonia Dana M.D.   On: 05/21/2016 10:19    Complexity Note: Imaging results reviewed. Results shared with Mr. Leino, using Layman's terms.                         Meds   Current Outpatient Medications:  .  gabapentin (NEURONTIN) 300 MG capsule, 300 mg qhs x 2 weeks then increase to BID, Disp: 90 capsule, Rfl: 2 .  cyclobenzaprine (FLEXERIL) 5 MG tablet, Take 5 mg by mouth 3 (three) times daily as needed for muscle spasms., Disp: , Rfl:  .  DULoxetine (CYMBALTA) 30 MG capsule, 30 mg qday for 2 weeks then 60 mg qday, Disp: 30 capsule, Rfl: 2 .  EPINEPHrine 0.3 mg/0.3 mL IJ SOAJ injection, Inject into the muscle once., Disp: , Rfl:  .  ketoconazole (NIZORAL) 200 MG tablet, Take by mouth daily., Disp: , Rfl:  .  methylPREDNISolone (MEDROL) 4 MG TBPK tablet, Follow package instructions., Disp: 21 tablet, Rfl: 0 .  naproxen (NAPROSYN) 500 MG tablet, Take 500 mg by mouth 2 (two) times daily with a meal., Disp: , Rfl:  .  pantoprazole (PROTONIX) 40 MG tablet, Take 40 mg by mouth daily., Disp: , Rfl:  .  selenium sulfide (SELSUN) 2.5 % shampoo, Apply 1 application topically daily as needed for irritation., Disp: , Rfl:   ROS  Constitutional: Denies any fever or chills Gastrointestinal: No reported hemesis, hematochezia, vomiting, or acute GI distress Musculoskeletal: Denies any acute onset joint swelling, redness, loss of ROM, or weakness Neurological: No reported episodes of acute onset  apraxia, aphasia, dysarthria, agnosia, amnesia, paralysis, loss of coordination, or loss of consciousness  Allergies  Mr. Helbert is allergic to bee venom.  [place]  Drug: Mr. Damiano  reports no history of drug use. Alcohol:  reports current alcohol use. Tobacco:  reports that he has never smoked. He has never  used smokeless tobacco. Medical:  has a past medical history of Anginal pain ([place]), DJD (degenerative joint disease), GERD (gastroesophageal reflux disease), H/O degenerative disc disease, Rhinitis, and Tinea. Surgical: Mr. Harts  has a past surgical history that includes Vasectomy; Vasectomy; and Esophagogastroduodenoscopy (egd) with propofol (N/A, 12/01/2017). Family: family history includes Diabetes in his mother; Healthy in his father.  Constitutional Exam  General appearance: Well nourished, well developed, and well hydrated. In no apparent acute distress Vitals:   11/22/18 0951  BP: (!) 144/88  Pulse: 100  Resp: 16  Temp: 98 F (36.7 C)  TempSrc: Oral  SpO2: 100%  Weight: 215 lb (97.5 kg)  Height: 5' 11"  (1.803 m)   BMI Assessment: Estimated body mass index is 29.99 kg/m as calculated from the following:   Height as of this encounter: 5' 11"  (1.803 m).   Weight as of this encounter: 215 lb (97.5 kg).  BMI interpretation table: BMI level Category Range association with higher incidence of chronic pain  <18 kg/m2 Underweight   18.5-24.9 kg/m2 Ideal body weight   25-29.9 kg/m2 Overweight Increased incidence by 20%  30-34.9 kg/m2 Obese (Class I) Increased incidence by 68%  35-39.9 kg/m2 Severe obesity (Class II) Increased incidence by 136%  >40 kg/m2 Extreme obesity (Class III) Increased incidence by 254%   Patient's current BMI Ideal Body weight  Body mass index is 29.99 kg/m. Ideal body weight: 75.3 kg (166 lb 0.1 oz) Adjusted ideal body weight: 84.2 kg (185 lb 9.7 oz)   BMI Readings from Last 4 Encounters:  11/22/18 29.99 kg/m  10/24/18 29.99 kg/m    10/08/18 29.99 kg/m  10/03/18 29.99 kg/m   Wt Readings from Last 4 Encounters:  11/22/18 215 lb (97.5 kg)  10/24/18 215 lb (97.5 kg)  10/08/18 215 lb (97.5 kg)  10/03/18 215 lb (97.5 kg)  Psych/Mental status: Alert, oriented x 3 (person, place, & time)       Eyes: PERLA Respiratory: No evidence of acute respiratory distress  Cervical Spine Area Exam  Skin & Axial Inspection: No masses, redness, edema, swelling, or associated skin lesions Alignment: Symmetrical Functional ROM: Decreased ROM, to the left Stability: No instability detected Muscle Tone/Strength: Functionally intact. No obvious neuro-muscular anomalies detected. Sensory (Neurological): Dermatomal pain pattern left C6, C7 Palpation: Tender             Positive Spurling's on the left 4 out of 5 strength left upper extremity: Shoulder abduction, elbow flexion, elbow extension, thumb extension.  Upper Extremity (UE) Exam    Side: Right upper extremity  Side: Left upper extremity  Skin & Extremity Inspection: Skin color, temperature, and hair growth are WNL. No peripheral edema or cyanosis. No masses, redness, swelling, asymmetry, or associated skin lesions. No contractures.  Skin & Extremity Inspection: Skin color, temperature, and hair growth are WNL. No peripheral edema or cyanosis. No masses, redness, swelling, asymmetry, or associated skin lesions. No contractures.  Functional ROM: Unrestricted ROM          Functional ROM: Decreased ROM for shoulder and elbow  Muscle Tone/Strength: Functionally intact. No obvious neuro-muscular anomalies detected.  Muscle Tone/Strength: Functionally intact. No obvious neuro-muscular anomalies detected.  Sensory (Neurological): Unimpaired          Sensory (Neurological): Dermatomal pain pattern affecting the wrist and hand  Palpation: No palpable anomalies              Palpation: No palpable anomalies  Provocative Test(s):  Phalen's test: deferred Tinel's test:  deferred Apley's scratch test (touch opposite shoulder):  Action 1 (Across chest): deferred Action 2 (Overhead): deferred Action 3 (LB reach): deferred   Provocative Test(s):  Phalen's test: deferred Tinel's test: deferred Apley's scratch test (touch opposite shoulder):  Action 1 (Across chest): deferred Action 2 (Overhead): deferred Action 3 (LB reach): deferred    Thoracic Spine Area Exam  Skin & Axial Inspection: No masses, redness, or swelling Alignment: Symmetrical Functional ROM: Unrestricted ROM Stability: No instability detected Muscle Tone/Strength: Functionally intact. No obvious neuro-muscular anomalies detected. Sensory (Neurological): Unimpaired Muscle strength & Tone: No palpable anomalies  Lumbar Spine Area Exam  Skin & Axial Inspection: No masses, redness, or swelling Alignment: Symmetrical Functional ROM: Unrestricted ROM       Stability: No instability detected Muscle Tone/Strength: Functionally intact. No obvious neuro-muscular anomalies detected. Sensory (Neurological): Unimpaired Palpation: No palpable anomalies       Provocative Tests: Hyperextension/rotation test: deferred today       Lumbar quadrant test (Kemp's test): deferred today       Lateral bending test: deferred today       Patrick's Maneuver: deferred today                   FABER* test: deferred today                   S-I anterior distraction/compression test: deferred today         S-I lateral compression test: deferred today         S-I Thigh-thrust test: deferred today         S-I Gaenslen's test: deferred today         *(Flexion, ABduction and External Rotation)  Gait & Posture Assessment  Ambulation: Unassisted Gait: Relatively normal for age and body habitus Posture: WNL   Lower Extremity Exam    Side: Right lower extremity  Side: Left lower extremity  Stability: No instability observed          Stability: No instability observed          Skin & Extremity Inspection: Skin  color, temperature, and hair growth are WNL. No peripheral edema or cyanosis. No masses, redness, swelling, asymmetry, or associated skin lesions. No contractures.  Skin & Extremity Inspection: Skin color, temperature, and hair growth are WNL. No peripheral edema or cyanosis. No masses, redness, swelling, asymmetry, or associated skin lesions. No contractures.  Functional ROM: Unrestricted ROM                  Functional ROM: Unrestricted ROM                  Muscle Tone/Strength: Functionally intact. No obvious neuro-muscular anomalies detected.  Muscle Tone/Strength: Functionally intact. No obvious neuro-muscular anomalies detected.  Sensory (Neurological): Unimpaired        Sensory (Neurological): Unimpaired        DTR: Patellar: deferred today Achilles: deferred today Plantar: deferred today  DTR: Patellar: deferred today Achilles: deferred today Plantar: deferred today  Palpation: No palpable anomalies  Palpation: No palpable anomalies   Assessment  Primary Diagnosis & Pertinent Problem List: The primary encounter diagnosis was Cervical radiculopathy (LEFT C6,7). Diagnoses of DDD (degenerative disc disease), cervical, Cervicalgia, and Chronic pain syndrome were also pertinent to this visit.  Status Diagnosis  Worsening Persistent Recurring 1. Cervical radiculopathy (LEFT C6,7)   2. DDD (degenerative disc disease), cervical   3. Cervicalgia   4. Chronic  pain syndrome     General Recommendations: The pain condition that the patient suffers from is best treated with a multidisciplinary approach that involves an increase in physical activity to prevent de-conditioning and worsening of the pain cycle, as well as psychological counseling (formal and/or informal) to address the co-morbid psychological affects of pain. Treatment will often involve judicious use of pain medications and interventional procedures to decrease the pain, allowing the patient to participate in the physical activity  that will ultimately produce long-lasting pain reductions. The goal of the multidisciplinary approach is to return the patient to a higher level of overall function and to restore their ability to perform activities of daily living.  48 year old male with a history of left neck pain that radiates down his left arm and forearm secondary to multilevel cervical neuroforaminal stenosis most pronounced at left C6/C7 affecting the left C7 nerve root.  Patient is status post left C7-T1 ESI which provided him with moderate yet short-term pain relief.  Patient follows up today with worsening pain and progressive difficulty in performing ADLs.  Patient was tearful during the encounter and states that he would like to see a neurosurgeon regarding his pain.  I also recommended that we try and repeat a second cervical epidural steroid injection.  Given that the patient has had little benefit with gabapentin, we will trial Cymbalta as below which could also be helpful for his symptoms of depressed mood and low energy.  I also recommend Medrol Dosepak as below.  Plan: -Stop gabapentin.  Start Cymbalta as below -Plan for left C7-T1 ESI #2 -Referral to neurosurgery for cervical radiculopathy at left C7 -Medrol Dosepak as below  Plan of Care  Pharmacotherapy (Medications Ordered): Meds ordered this encounter  Medications  . DULoxetine (CYMBALTA) 30 MG capsule    Sig: 30 mg qday for 2 weeks then 60 mg qday    Dispense:  30 capsule    Refill:  2  . methylPREDNISolone (MEDROL) 4 MG TBPK tablet    Sig: Follow package instructions.    Dispense:  21 tablet    Refill:  0    Do not add to the "Automatic Refill" notification system.   Lab-work, procedure(s), and/or referral(s): Orders Placed This Encounter  Procedures  . Cervical Epidural Injection  . Ambulatory referral to Neurosurgery   Provider-requested follow-up: Return for Procedure.  Future Appointments  Date Time Provider [place]  12/05/2018   9:00 AM Gillis Santa, MD [facility] None    Primary Care Physician: Sallee Lange, NP Location: [facility] Note by: Gillis Santa, M.D Date: 11/22/2018; Time: 11:58 AM  Patient Instructions   Moderate Conscious Sedation, Adult Sedation is the use of medicines to promote relaxation and relieve discomfort and anxiety. Moderate conscious sedation is a type of sedation. Under moderate conscious sedation, you are less alert than normal, but you are still able to respond to instructions, touch, or both. Moderate conscious sedation is used during short medical and dental procedures. It is milder than deep sedation, which is a type of sedation under which you cannot be easily woken up. It is also milder than general anesthesia, which is the use of medicines to make you unconscious. Moderate conscious sedation allows you to return to your regular activities sooner. Tell a health care provider about:  Any allergies you have.  All medicines you are taking, including vitamins, herbs, eye drops, creams, and over-the-counter medicines.  Use of steroids (by mouth or creams).  Any problems you  or family members have had with sedatives and anesthetic medicines.  Any blood disorders you have.  Any surgeries you have had.  Any medical conditions you have, such as sleep apnea.  Whether you are pregnant or may be pregnant.  Any use of cigarettes, alcohol, marijuana, or street drugs. What are the risks? Generally, this is a safe procedure. However, problems may occur, including:  Getting too much medicine (oversedation).  Nausea.  Allergic reaction to medicines.  Trouble breathing. If this happens, a breathing tube may be used to help with breathing. It will be removed when you are awake and breathing on your own.  Heart trouble.  Lung trouble. What happens before the procedure? Staying hydrated Follow instructions from your health care provider about  hydration, which may include:  Up to 2 hours before the procedure - you may continue to drink clear liquids, such as water, clear fruit juice, black coffee, and plain tea. Eating and drinking restrictions Follow instructions from your health care provider about eating and drinking, which may include:  8 hours before the procedure - stop eating heavy meals or foods such as meat, fried foods, or fatty foods.  6 hours before the procedure - stop eating light meals or foods, such as toast or cereal.  6 hours before the procedure - stop drinking milk or drinks that contain milk.  2 hours before the procedure - stop drinking clear liquids. Medicine Ask your health care provider about:  Changing or stopping your regular medicines. This is especially important if you are taking diabetes medicines or blood thinners.  Taking medicines such as aspirin and ibuprofen. These medicines can thin your blood. Do not take these medicines before your procedure if your health care provider instructs you not to.  Tests and exams  You will have a physical exam.  You may have blood tests done to show: ? How well your kidneys and liver are working. ? How well your blood can clot. General instructions  Plan to have someone take you home from the hospital or clinic.  If you will be going home right after the procedure, plan to have someone with you for 24 hours. What happens during the procedure?  An IV tube will be inserted into one of your veins.  Medicine to help you relax (sedative) will be given through the IV tube.  The medical or dental procedure will be performed. What happens after the procedure?  Your blood pressure, heart rate, breathing rate, and blood oxygen level will be monitored often until the medicines you were given have worn off.  Do not drive for 24 hours. This information is not intended to replace advice given to you by your health care provider. Make sure you discuss any  questions you have with your health care provider. Document Released: 07/26/2001 Document Revised: 04/05/2016 Document Reviewed: 02/20/2016 Elsevier Interactive Patient Education  2019 [place]  What are the risk, side effects and possible complications? Generally speaking, most procedures are safe.  However, with any procedure there are risks, side effects, and the possibility of complications.  The risks and complications are dependent upon the sites that are lesioned, or the type of nerve block to be performed.  The closer the procedure is to the spine, the more serious the risks are.  Great care is taken when placing the radio frequency needles, block needles or lesioning probes, but sometimes complications can occur. 1. Infection: Any time there is an injection through  the skin, there is a risk of infection.  This is why sterile conditions are used for these blocks.  There are four possible types of infection. 1. Localized skin infection. 2. Central Nervous System Infection-This can be in the form of Meningitis, which can be deadly. 3. Epidural Infections-This can be in the form of an epidural abscess, which can cause pressure inside of the spine, causing compression of the spinal cord with subsequent paralysis. This would require an emergency surgery to decompress, and there are no guarantees that the patient would recover from the paralysis. 4. Discitis-This is an infection of the intervertebral discs.  It occurs in about 1% of discography procedures.  It is difficult to treat and it may lead to surgery.        2. Pain: the needles have to go through skin and soft tissues, will cause soreness.       3. Damage to internal structures:  The nerves to be lesioned may be near blood vessels or    other nerves which can be potentially damaged.       4. Bleeding: Bleeding is more common if the patient is taking blood thinners such as  aspirin, Coumadin, Ticiid,  Plavix, etc., or if he/she have some genetic predisposition  such as hemophilia. Bleeding into the spinal canal can cause compression of the spinal  cord with subsequent paralysis.  This would require an emergency surgery to  decompress and there are no guarantees that the patient would recover from the  paralysis.       5. Pneumothorax:  Puncturing of a lung is a possibility, every time a needle is introduced in  the area of the chest or upper back.  Pneumothorax refers to free air around the  collapsed lung(s), inside of the thoracic cavity (chest cavity).  Another two possible  complications related to a similar event would include: Hemothorax and Chylothorax.   These are variations of the Pneumothorax, where instead of air around the collapsed  lung(s), you may have blood or chyle, respectively.       6. Spinal headaches: They may occur with any procedures in the area of the spine.       7. Persistent CSF (Cerebro-Spinal Fluid) leakage: This is a rare problem, but may occur  with prolonged intrathecal or epidural catheters either due to the formation of a fistulous  track or a dural tear.       8. Nerve damage: By working so close to the spinal cord, there is always a possibility of  nerve damage, which could be as serious as a permanent spinal cord injury with  paralysis.       9. Death:  Although rare, severe deadly allergic reactions known as "Anaphylactic  reaction" can occur to any of the medications used.      10. Worsening of the symptoms:  We can always make thing worse.  What are the chances of something like this happening? Chances of any of this occuring are extremely low.  By statistics, you have more of a chance of getting killed in a motor vehicle accident: while driving to the hospital than any of the above occurring .  Nevertheless, you should be aware that they are possibilities.  In general, it is similar to taking a shower.  Everybody knows that you can slip, hit your head and get  killed.  Does that mean that you should not shower again?  Nevertheless always keep in mind that statistics do not  mean anything if you happen to be on the wrong side of them.  Even if a procedure has a 1 (one) in a 1,000,000 (million) chance of going wrong, it you happen to be that one..Also, keep in mind that by statistics, you have more of a chance of having something go wrong when taking medications.  Who should not have this procedure? If you are on a blood thinning medication (e.g. Coumadin, Plavix, see list of "Blood Thinners"), or if you have an active infection going on, you should not have the procedure.  If you are taking any blood thinners, please inform your physician.  How should I prepare for this procedure?  Do not eat or drink anything at least six hours prior to the procedure.  Bring a driver with you .  It cannot be a taxi.  Come accompanied by an adult that can drive you back, and that is strong enough to help you if your legs get weak or numb from the local anesthetic.  Take all of your medicines the morning of the procedure with just enough water to swallow them.  If you have diabetes, make sure that you are scheduled to have your procedure done first thing in the morning, whenever possible.  If you have diabetes, take only half of your insulin dose and notify our nurse that you have done so as soon as you arrive at the clinic.  If you are diabetic, but only take blood sugar pills (oral hypoglycemic), then do not take them on the morning of your procedure.  You may take them after you have had the procedure.  Do not take aspirin or any aspirin-containing medications, at least eleven (11) days prior to the procedure.  They may prolong bleeding.  Wear loose fitting clothing that may be easy to take off and that you would not mind if it got stained with Betadine or blood.  Do not wear any jewelry or perfume  Remove any nail coloring.  It will interfere with some of our  monitoring equipment.  NOTE: Remember that this is not meant to be interpreted as a complete list of all possible complications.  Unforeseen problems may occur.  BLOOD THINNERS The following drugs contain aspirin or other products, which can cause increased bleeding during surgery and should not be taken for 2 weeks prior to and 1 week after surgery.  If you should need take something for relief of minor pain, you may take acetaminophen which is found in Tylenol,m Datril, Anacin-3 and Panadol. It is not blood thinner. The products listed below are.  Do not take any of the products listed below in addition to any listed on your instruction sheet.  A.P.C or A.P.C with Codeine Codeine Phosphate Capsules #3 Ibuprofen Ridaura  ABC compound Congesprin Imuran rimadil  Advil Cope Indocin Robaxisal  Alka-Seltzer Effervescent Pain Reliever and Antacid Coricidin or Coricidin-D  Indomethacin Rufen  Alka-Seltzer plus Cold Medicine Cosprin Ketoprofen S-A-C Tablets  Anacin Analgesic Tablets or Capsules Coumadin Korlgesic Salflex  Anacin Extra Strength Analgesic tablets or capsules CP-2 Tablets Lanoril Salicylate  Anaprox Cuprimine Capsules Levenox Salocol  Anexsia-D Dalteparin Magan Salsalate  Anodynos Darvon compound Magnesium Salicylate Sine-off  Ansaid Dasin Capsules Magsal Sodium Salicylate  Anturane Depen Capsules Marnal Soma  APF Arthritis pain formula Dewitt's Pills Measurin Stanback  Argesic Dia-Gesic Meclofenamic Sulfinpyrazone  Arthritis Bayer Timed Release Aspirin Diclofenac Meclomen Sulindac  Arthritis pain formula Anacin Dicumarol Medipren Supac  Analgesic (Safety coated) Arthralgen Diffunasal Mefanamic Suprofen  Arthritis  Strength Bufferin Dihydrocodeine Mepro Compound Suprol  Arthropan liquid Dopirydamole Methcarbomol with Aspirin Synalgos  ASA tablets/Enseals Disalcid Micrainin Tagament  Ascriptin Doan's Midol Talwin  Ascriptin A/D Dolene Mobidin Tanderil  Ascriptin Extra Strength  Dolobid Moblgesic Ticlid  Ascriptin with Codeine Doloprin or Doloprin with Codeine Momentum Tolectin  Asperbuf Duoprin Mono-gesic Trendar  Aspergum Duradyne Motrin or Motrin IB Triminicin  Aspirin plain, buffered or enteric coated Durasal Myochrisine Trigesic  Aspirin Suppositories Easprin Nalfon Trillsate  Aspirin with Codeine Ecotrin Regular or Extra Strength Naprosyn Uracel  Atromid-S Efficin Naproxen Ursinus  Auranofin Capsules Elmiron Neocylate Vanquish  Axotal Emagrin Norgesic Verin  Azathioprine Empirin or Empirin with Codeine Normiflo Vitamin E  Azolid Emprazil Nuprin Voltaren  Bayer Aspirin plain, buffered or children's or timed BC Tablets or powders Encaprin Orgaran Warfarin Sodium  Buff-a-Comp Enoxaparin Orudis Zorpin  Buff-a-Comp with Codeine Equegesic Os-Cal-Gesic   Buffaprin Excedrin plain, buffered or Extra Strength Oxalid   Bufferin Arthritis Strength Feldene Oxphenbutazone   Bufferin plain or Extra Strength Feldene Capsules Oxycodone with Aspirin   Bufferin with Codeine Fenoprofen Fenoprofen Pabalate or Pabalate-SF   Buffets II Flogesic Panagesic   Buffinol plain or Extra Strength Florinal or Florinal with Codeine Panwarfarin   Buf-Tabs Flurbiprofen Penicillamine   Butalbital Compound Four-way cold tablets Penicillin   Butazolidin Fragmin Pepto-Bismol   Carbenicillin Geminisyn Percodan   Carna Arthritis Reliever Geopen Persantine   Carprofen Gold's salt Persistin   Chloramphenicol Goody's Phenylbutazone   Chloromycetin Haltrain Piroxlcam   Clmetidine heparin Plaquenil   Cllnoril Hyco-pap Ponstel   Clofibrate Hydroxy chloroquine Propoxyphen         Before stopping any of these medications, be sure to consult the physician who ordered them.  Some, such as Coumadin (Warfarin) are ordered to prevent or treat serious conditions such as "deep thrombosis", "pumonary embolisms", and other heart problems.  The amount of time that you may need off of the medication may also  vary with the medication and the reason for which you were taking it.  If you are taking any of these medications, please make sure you notify your pain physician before you undergo any procedures.       Methilprednisolone and cymbalta have been escribed to your pharmacy.  Epidural Steroid Injection Patient Information  Description: The epidural space surrounds the nerves as they exit the spinal cord.  In some patients, the nerves can be compressed and inflamed by a bulging disc or a tight spinal canal (spinal stenosis).  By injecting steroids into the epidural space, we can bring irritated nerves into direct contact with a potentially helpful medication.  These steroids act directly on the irritated nerves and can reduce swelling and inflammation which often leads to decreased pain.  Epidural steroids may be injected anywhere along the spine and from the neck to the low back depending upon the location of your pain.   After numbing the skin with local anesthetic (like Novocaine), a small needle is passed into the epidural space slowly.  You may experience a sensation of pressure while this is being done.  The entire block usually last less than 10 minutes.  Conditions which may be treated by epidural steroids:   Low back and leg pain  Neck and arm pain  Spinal stenosis  Post-laminectomy syndrome  Herpes zoster (shingles) pain  Pain from compression fractures  Preparation for the injection:  1. Do not eat any solid food or dairy products within 8 hours of your appointment.  2. You may drink clear  liquids up to 3 hours before appointment.  Clear liquids include water, black coffee, juice or soda.  No milk or cream please. 3. You may take your regular medication, including pain medications, with a sip of water before your appointment  Diabetics should hold regular insulin (if taken separately) and take 1/2 normal NPH dos the morning of the procedure.  Carry some sugar containing items  with you to your appointment. 4. A driver must accompany you and be prepared to drive you home after your procedure.  5. Bring all your current medications with your. 6. An IV may be inserted and sedation may be given at the discretion of the physician.   7. A blood pressure cuff, EKG and other monitors will often be applied during the procedure.  Some patients may need to have extra oxygen administered for a short period. 8. You will be asked to provide medical information, including your allergies, prior to the procedure.  We must know immediately if you are taking blood thinners (like Coumadin/Warfarin)  Or if you are allergic to IV iodine contrast (dye). We must know if you could possible be pregnant.  Possible side-effects:  Bleeding from needle site  Infection (rare, may require surgery)  Nerve injury (rare)  Numbness & tingling (temporary)  Difficulty urinating (rare, temporary)  Spinal headache ( a headache worse with upright posture)  Light -headedness (temporary)  Pain at injection site (several days)  Decreased blood pressure (temporary)  Weakness in arm/leg (temporary)  Pressure sensation in back/neck (temporary)  Call if you experience:  Fever/chills associated with headache or increased back/neck pain.  Headache worsened by an upright position.  New onset weakness or numbness of an extremity below the injection site  Hives or difficulty breathing (go to the emergency room)  Inflammation or drainage at the infection site  Severe back/neck pain  Any new symptoms which are concerning to you  Please note:  Although the local anesthetic injected can often make your back or neck feel good for several hours after the injection, the pain will likely return.  It takes 3-7 days for steroids to work in the epidural space.  You may not notice any pain relief for at least that one week.  If effective, we will often do a series of three injections spaced 3-6 weeks  apart to maximally decrease your pain.  After the initial series, we generally will wait several months before considering a repeat injection of the same type.  If you have any questions, please call [phone] [facility]

## 2018-12-04 ENCOUNTER — Telehealth: Payer: Self-pay | Admitting: Student in an Organized Health Care Education/Training Program

## 2018-12-04 NOTE — Telephone Encounter (Signed)
Patty called from Dr. Zoila Shutter office called and said that the pt will need an updated MRI before he can be scheduled with Dr. Myer Haff and the pt requested for Dr. Cherylann Ratel to order the MRI.

## 2018-12-04 NOTE — Telephone Encounter (Signed)
Will you do this? 

## 2018-12-05 ENCOUNTER — Other Ambulatory Visit: Payer: Self-pay

## 2018-12-05 ENCOUNTER — Encounter: Payer: Self-pay | Admitting: Student in an Organized Health Care Education/Training Program

## 2018-12-05 ENCOUNTER — Ambulatory Visit (HOSPITAL_BASED_OUTPATIENT_CLINIC_OR_DEPARTMENT_OTHER): Payer: BC Managed Care – PPO | Admitting: Student in an Organized Health Care Education/Training Program

## 2018-12-05 ENCOUNTER — Ambulatory Visit
Admission: RE | Admit: 2018-12-05 | Discharge: 2018-12-05 | Disposition: A | Discharge status: Home / Self Care | Payer: BC Managed Care – PPO | Source: Ambulatory Visit | Attending: Student in an Organized Health Care Education/Training Program | Admitting: Student in an Organized Health Care Education/Training Program

## 2018-12-05 VITALS — BP 134/95 | HR 99 | Temp 98.5°F | Resp 16 | Ht 71.0 in | Wt 215.0 lb

## 2018-12-05 DIAGNOSIS — M5412 Radiculopathy, cervical region: Secondary | ICD-10-CM | POA: Insufficient documentation

## 2018-12-05 MED ORDER — LIDOCAINE HCL 2 % IJ SOLN
10.0000 mL | Freq: Once | INTRAMUSCULAR | Status: AC
  Administered 2018-12-05: 400 mg
  Filled 2018-12-05: qty 20

## 2018-12-05 MED ORDER — DEXAMETHASONE SODIUM PHOSPHATE 10 MG/ML IJ SOLN
10.0000 mg | Freq: Once | INTRAMUSCULAR | Status: AC
  Administered 2018-12-05: 10 mg
  Filled 2018-12-05: qty 1

## 2018-12-05 MED ORDER — SODIUM CHLORIDE 0.9% FLUSH: 1.0000 mL | Freq: Once | INTRAVENOUS | Status: DC

## 2018-12-05 MED ORDER — ROPIVACAINE HCL 2 MG/ML IJ SOLN
1.0000 mL | Freq: Once | INTRAMUSCULAR | Status: AC
  Administered 2018-12-05: 10 mL via EPIDURAL
  Filled 2018-12-05: qty 10

## 2018-12-05 MED ORDER — IOPAMIDOL (ISOVUE-M 200) INJECTION 41%
10.0000 mL | Freq: Once | INTRAMUSCULAR | Status: AC
  Administered 2018-12-05: 10 mL via EPIDURAL
  Filled 2018-12-05: qty 10

## 2018-12-05 NOTE — Progress Notes (Signed)
Safety precautions to be maintained throughout the outpatient stay will include: orient to surroundings, keep bed in low position, maintain call bell within reach at all times, provide assistance with transfer out of bed and ambulation.  

## 2018-12-05 NOTE — Patient Instructions (Signed)

## 2018-12-05 NOTE — Telephone Encounter (Signed)
Please take care of this. Thanks  

## 2018-12-05 NOTE — Progress Notes (Signed)
Patient's Name: Kyle Fields  MRN: [id number]  Referring Provider: Myrene BuddyGauger, Sarah Kathryn, *  DOB: 11/23/1970  PCP: Myrene BuddyGauger, Sarah Kathryn, NP  DOS: 12/05/2018  Note by: Edward JollyBilal Benji Poynter, MD  Service setting: Ambulatory outpatient  Specialty: Interventional Pain Management  Patient type: Established  Location: [facility] (AMB) Pain Management Facility  Visit type: Interventional Procedure   Primary Reason for Visit: Interventional Pain Management Treatment. CC: Neck Pain  Procedure:          Anesthesia, Analgesia, Anxiolysis:  Type: Diagnostic, Inter-Laminar, Cervical Epidural Steroid Injection  #2  Region: Posterior Cervico-thoracic Region Level: C7-T1 Laterality: Left-Sided Paramedial  Type: Local Anesthesia Indication(s): Analgesia         Route: Infiltration ([place]/IM) IV Access: Declined Sedation: Declined  Local Anesthetic: Lidocaine 1-2%  Position: Prone with head of the table was raised to facilitate breathing.   Indications: 1. Cervical radiculopathy (LEFT C6,7)    Pain Score: Pre-procedure: 4 /10 Post-procedure: 1 /10  Pre-op Assessment:  Kyle Fields is a 48 y.o. (year old), male patient, seen today for interventional treatment. He  has a past surgical history that includes Vasectomy; Vasectomy; and Esophagogastroduodenoscopy (egd) with propofol (N/A, 12/01/2017). Kyle Fields has a current medication list which includes the following prescription(s): cyclobenzaprine, duloxetine, epinephrine, gabapentin, ketoconazole, naproxen, pantoprazole, and selenium sulfide, and the following Facility-Administered Medications: sodium chloride flush. His primarily concern today is the Neck Pain  Initial Vital Signs:  Pulse/HCG Rate: 99ECG Heart Rate: (!) 113 Temp: 98.5 F (36.9 C) Resp: 18 BP: (!) 133/93 SpO2: 100 %  BMI: Estimated body mass index is 29.99 kg/m as calculated from the following:   Height as of this encounter: 5\' 11"  (1.803 m).   Weight as of this encounter: 215 lb (97.5  kg).  Risk Assessment: Allergies: Reviewed. He is allergic to bee venom.  Allergy Precautions: None required Coagulopathies: Reviewed. None identified.  Blood-thinner therapy: None at this time Active Infection(s): Reviewed. None identified. Kyle Fields is afebrile  Site Confirmation: Kyle Fields was asked to confirm the procedure and laterality before marking the site Procedure checklist: Completed Consent: Before the procedure and under the influence of no sedative(s), amnesic(s), or anxiolytics, the patient was informed of the treatment options, risks and possible complications. To fulfill our ethical and legal obligations, as recommended by the American Medical Association's Code of Ethics, I have informed the patient of my clinical impression; the nature and purpose of the treatment or procedure; the risks, benefits, and possible complications of the intervention; the alternatives, including doing nothing; the risk(s) and benefit(s) of the alternative treatment(s) or procedure(s); and the risk(s) and benefit(s) of doing nothing. The patient was provided information about the general risks and possible complications associated with the procedure. These may include, but are not limited to: failure to achieve desired goals, infection, bleeding, organ or nerve damage, allergic reactions, paralysis, and death. In addition, the patient was informed of those risks and complications associated to Spine-related procedures, such as failure to decrease pain; infection (i.e.: Meningitis, epidural or intraspinal abscess); bleeding (i.e.: epidural hematoma, subarachnoid hemorrhage, or any other type of intraspinal or peri-dural bleeding); organ or nerve damage (i.e.: Any type of peripheral nerve, nerve root, or spinal cord injury) with subsequent damage to sensory, motor, and/or autonomic systems, resulting in permanent pain, numbness, and/or weakness of one or several areas of the body; allergic reactions;  (i.e.: anaphylactic reaction); and/or death. Furthermore, the patient was informed of those risks and complications associated with the medications. These include, but are not  limited to: allergic reactions (i.e.: anaphylactic or anaphylactoid reaction(s)); adrenal axis suppression; blood sugar elevation that in diabetics may result in ketoacidosis or comma; water retention that in patients with history of congestive heart failure may result in shortness of breath, pulmonary edema, and decompensation with resultant heart failure; weight gain; swelling or edema; medication-induced neural toxicity; particulate matter embolism and blood vessel occlusion with resultant organ, and/or nervous system infarction; and/or aseptic necrosis of one or more joints. Finally, the patient was informed that Medicine is not an exact science; therefore, there is also the possibility of unforeseen or unpredictable risks and/or possible complications that may result in a catastrophic outcome. The patient indicated having understood very clearly. We have given the patient no guarantees and we have made no promises. Enough time was given to the patient to ask questions, all of which were answered to the patient's satisfaction. Kyle Fields has indicated that he wanted to continue with the procedure. Attestation: I, the ordering provider, attest that I have discussed with the patient the benefits, risks, side-effects, alternatives, likelihood of achieving goals, and potential problems during recovery for the procedure that I have provided informed consent. Date  Time: 12/05/2018  8:48 AM  Pre-Procedure Preparation:  Monitoring: As per clinic protocol. Respiration, ETCO2, SpO2, BP, heart rate and rhythm monitor placed and checked for adequate function Safety Precautions: Patient was assessed for positional comfort and pressure points before starting the procedure. Time-out: I initiated and conducted the "Time-out" before starting  the procedure, as per protocol. The patient was asked to participate by confirming the accuracy of the "Time Out" information. Verification of the correct person, site, and procedure were performed and confirmed by me, the nursing staff, and the patient. "Time-out" conducted as per Joint Commission's Universal Protocol (UP.01.01.01). Time: 0940  Description of Procedure:          Target Area: For Epidural Steroid injections the target is the interlaminar space, initially targeting the lower border of the superior vertebral body lamina. Approach: Paramedial approach. Area Prepped: Entire PosteriorCervical Region Prepping solution: ChloraPrep (2% chlorhexidine gluconate and 70% isopropyl alcohol) Safety Precautions: Aspiration looking for blood return was conducted prior to all injections. At no point did we inject any substances, as a needle was being advanced. No attempts were made at seeking any paresthesias. Safe injection practices and needle disposal techniques used. Medications properly checked for expiration dates. SDV (single dose vial) medications used. Description of the Procedure: Protocol guidelines were followed. The procedure needle was introduced through the skin, ipsilateral to the reported pain, and advanced to the target area. Bone was contacted and the needle walked caudad, until the lamina was cleared. The epidural space was identified using "loss-of-resistance technique" with 2-3 ml of PF-NaCl (0.9% NSS), in a 5cc LOR glass syringe. Vitals:   12/05/18 0853 12/05/18 0940 12/05/18 0945 12/05/18 0951  BP: (!) 133/93 (!) 146/92 121/84 (!) 134/95  Pulse: 99     Resp: 18 20 13 16   Temp: 98.5 F (36.9 C)     TempSrc: Oral     SpO2: 100% 98% 98% 97%  Weight: 215 lb (97.5 kg)     Height: 5\' 11"  (1.803 m)       Start Time: 0940 hrs. End Time: 0948 hrs. Materials:  Needle(s) Type: Epidural needle Gauge: 17G Length: 3.5-in Medication(s): Please see orders for medications and  dosing details. 4.5 cc solution consisting of 2.5 cc of preservative-free saline, 1 cc of 0.2% ropivacaine, 1 cc of Decadron 10 mg/cc Imaging  Guidance (Spinal):          Type of Imaging Technique: Fluoroscopy Guidance (Spinal) Indication(s): Assistance in needle guidance and placement for procedures requiring needle placement in or near specific anatomical locations not easily accessible without such assistance. Exposure Time: Please see nurses notes. Contrast: Before injecting any contrast, we confirmed that the patient did not have an allergy to iodine, shellfish, or radiological contrast. Once satisfactory needle placement was completed at the desired level, radiological contrast was injected. Contrast injected under live fluoroscopy. No contrast complications. See chart for type and volume of contrast used. Fluoroscopic Guidance: I was personally present during the use of fluoroscopy. "Tunnel Vision Technique" used to obtain the best possible view of the target area. Parallax error corrected before commencing the procedure. "Direction-depth-direction" technique used to introduce the needle under continuous pulsed fluoroscopy. Once target was reached, antero-posterior, oblique, and lateral fluoroscopic projection used confirm needle placement in all planes. Images permanently stored in EMR. Interpretation: I personally interpreted the imaging intraoperatively. Adequate needle placement confirmed in multiple planes. Appropriate spread of contrast into desired area was observed. No evidence of afferent or efferent intravascular uptake. No intrathecal or subarachnoid spread observed. Permanent images saved into the patient's record.  Antibiotic Prophylaxis:   Anti-infectives (From admission, onward)   None     Indication(s): None identified  Post-operative Assessment:  Post-procedure Vital Signs:  Pulse/HCG Rate: 9985 Temp: 98.5 F (36.9 C) Resp: 16 BP: (!) 134/95 SpO2: 97 %  EBL:  None  Complications: No immediate post-treatment complications observed by team, or reported by patient.  Note: The patient tolerated the entire procedure well. A repeat set of vitals were taken after the procedure and the patient was kept under observation following institutional policy, for this type of procedure. Post-procedural neurological assessment was performed, showing return to baseline, prior to discharge. The patient was provided with post-procedure discharge instructions, including a section on how to identify potential problems. Should any problems arise concerning this procedure, the patient was given instructions to immediately contact us, at any time, without hesitation. In any case, we plan to contact the patient by telephone for a follow-up status report regarding this interventional procedure.  Comments:  No additional relevant information. 5 out of 5 strength bilateral upper extremity: Shoulder abduction, elbow flexion, elbow extension, thumb extension.  Plan of Care    Imaging Orders     DG C-Arm 1-60 Min-No Report     MR CERVICAL SPINE WO CONTRAST Procedure Orders    No procedure(s) ordered today    Medications ordered for procedure: Meds ordered this encounter  Medications  . iopamidol (ISOVUE-M) 41 % intrathecal injection 10 mL  . dexamethasone (DECADRON) injection 10 mg  . ropivacaine (PF) 2 mg/mL (0.2%) (NAROPIN) injection 1 mL  . sodium chloride flush (NS) 0.9 % injection 1 mL  . lidocaine (XYLOCAINE) 2 % (with pres) injection 200 mg   Medications administered: We administered iopamidol, dexamethasone, ropivacaine (PF) 2 mg/mL (0.2%), and lidocaine.  See the medical record for exact dosing, route, and time of administration.  Disposition: Discharge home  Discharge Date & Time: 12/05/2018; 1000 hrs.   Physician-requested Follow-up: Return in about 4 weeks (around 01/02/2019) for Post Procedure Evaluation.  Future Appointments  Date Time Provider  Department Center  01/03/2019  8:45 AM Edward Jolly, MD [facility] None   Primary Care Physician: Myrene Buddy, NP Location: [facility] Out[facility] Management Facility Note by: Edward Jolly, MD Date: 12/05/2018; Time: 10:29 AM  Disclaimer:  Medicine is  not an Chief Strategy Officer. The only guarantee in medicine is that nothing is guaranteed. It is important to note that the decision to proceed with this intervention was based on the information collected from the patient. The Data and conclusions were drawn from the patient's questionnaire, the interview, and the physical examination. Because the information was provided in large part by the patient, it cannot be guaranteed that it has not been purposely or unconsciously manipulated. Every effort has been made to obtain as much relevant data as possible for this evaluation. It is important to note that the conclusions that lead to this procedure are derived in large part from the available data. Always take into account that the treatment will also be dependent on availability of resources and existing treatment guidelines, considered by other Pain Management Practitioners as being common knowledge and practice, at the time of the intervention. For Medico-Legal purposes, it is also important to point out that variation in procedural techniques and pharmacological choices are the acceptable norm. The indications, contraindications, technique, and results of the above procedure should only be interpreted and judged by a Board-Certified Interventional Pain Specialist with extensive familiarity and expertise in the same exact procedure and technique.

## 2018-12-06 ENCOUNTER — Telehealth: Payer: Self-pay | Admitting: *Deleted

## 2018-12-06 ENCOUNTER — Encounter: Payer: Self-pay | Admitting: Student in an Organized Health Care Education/Training Program

## 2018-12-06 NOTE — Telephone Encounter (Signed)
Attempted to call for post procedure follow-up. Message left. 

## 2018-12-14 ENCOUNTER — Other Ambulatory Visit: Payer: Self-pay | Admitting: Nurse Practitioner

## 2018-12-14 DIAGNOSIS — M4802 Spinal stenosis, cervical region: Secondary | ICD-10-CM

## 2018-12-20 ENCOUNTER — Ambulatory Visit
Admission: RE | Admit: 2018-12-20 | Discharge: 2018-12-20 | Disposition: A | Discharge status: Home / Self Care | Payer: BC Managed Care – PPO | Source: Ambulatory Visit | Attending: Nurse Practitioner | Admitting: Nurse Practitioner

## 2018-12-20 DIAGNOSIS — M4802 Spinal stenosis, cervical region: Secondary | ICD-10-CM | POA: Diagnosis not present

## 2018-12-31 ENCOUNTER — Encounter: Payer: Self-pay | Admitting: Student in an Organized Health Care Education/Training Program

## 2019-01-01 ENCOUNTER — Encounter
Admission: RE | Admit: 2019-01-01 | Discharge: 2019-01-01 | Disposition: A | Discharge status: Home / Self Care | Payer: BC Managed Care – PPO | Source: Ambulatory Visit | Attending: Neurosurgery | Admitting: Neurosurgery

## 2019-01-01 ENCOUNTER — Other Ambulatory Visit: Payer: Self-pay

## 2019-01-01 DIAGNOSIS — Z01818 Encounter for other preprocedural examination: Secondary | ICD-10-CM | POA: Insufficient documentation

## 2019-01-01 DIAGNOSIS — Z01810 Encounter for preprocedural cardiovascular examination: Secondary | ICD-10-CM

## 2019-01-01 HISTORY — DX: Pain in arm, unspecified: M79.603

## 2019-01-01 HISTORY — DX: Essential (primary) hypertension: I10

## 2019-01-01 LAB — URINALYSIS, COMPLETE (UACMP) WITH MICROSCOPIC
BILIRUBIN URINE: NEGATIVE
Bacteria, UA: NONE SEEN
Glucose, UA: NEGATIVE mg/dL
HGB URINE DIPSTICK: NEGATIVE
KETONES UR: NEGATIVE mg/dL
LEUKOCYTE UA: NEGATIVE
NITRITE: NEGATIVE
PH: 6 (ref 5.0–8.0)
PROTEIN: NEGATIVE mg/dL
Specific Gravity, Urine: 1.012 (ref 1.005–1.030)

## 2019-01-01 LAB — BASIC METABOLIC PANEL
Anion gap: 4 — ABNORMAL LOW (ref 5–15)
BUN: 16 mg/dL (ref 6–20)
CO2: 28 mmol/L (ref 22–32)
Calcium: 9.3 mg/dL (ref 8.9–10.3)
Chloride: 108 mmol/L (ref 98–111)
Creatinine, Ser: 1.39 mg/dL — ABNORMAL HIGH (ref 0.61–1.24)
GFR calc Af Amer: >60 mL/min (ref >60)
GFR, EST NON AFRICAN AMERICAN: 60 mL/min — AB (ref >60)
Glucose, Bld: 83 mg/dL (ref 70–99)
POTASSIUM: 3.8 mmol/L (ref 3.5–5.1)
Sodium: 140 mmol/L (ref 135–145)

## 2019-01-01 LAB — TYPE AND SCREEN
ABO/RH(D): B POS
ANTIBODY SCREEN: NEGATIVE

## 2019-01-01 LAB — PROTIME-INR
INR: 1.07
Prothrombin Time: 13.8 seconds (ref 11.4–15.2)

## 2019-01-01 LAB — CBC
HCT: 44.8 % (ref 39.0–52.0)
Hemoglobin: 15.1 g/dL (ref 13.0–17.0)
MCH: 27.7 pg (ref 26.0–34.0)
MCHC: 33.7 g/dL (ref 30.0–36.0)
MCV: 82.2 fL (ref 80.0–100.0)
NRBC: 0 % (ref 0.0–0.2)
Platelets: 198 10*3/uL (ref 150–400)
RBC: 5.45 MIL/uL (ref 4.22–5.81)
RDW: 13.2 % (ref 11.5–15.5)
WBC: 3.9 10*3/uL — ABNORMAL LOW (ref 4.0–10.5)

## 2019-01-01 LAB — SURGICAL PCR SCREEN
MRSA, PCR: NEGATIVE
STAPHYLOCOCCUS AUREUS: NEGATIVE

## 2019-01-01 LAB — APTT: APTT: 34 s (ref 24–36)

## 2019-01-01 NOTE — Patient Instructions (Signed)
Your procedure is scheduled on: 01/02/2019 Wed Report to Same Day Surgery 2nd floor medical mall [facility] Entrance-take elevator on left to 2nd floor.  Check in with surgery information desk.) Arrival time 9:00am  Remember: Instructions that are not followed completely may result in serious medical risk, up to and including death, or upon the discretion of your surgeon and anesthesiologist your surgery may need to be rescheduled.    _x___ 1. Do not eat food after midnight the night before your procedure. You may drink clear liquids up to 2 hours before you are scheduled to arrive at the hospital for your procedure.  Do not drink clear liquids within 2 hours of your scheduled arrival to the hospital.  Clear liquids include  --Water or Apple juice without pulp  --Clear carbohydrate beverage such as ClearFast or Gatorade  --Black Coffee or Clear Tea (No milk, no creamers, do not add anything to                  the coffee or Tea Type 1 and type 2 diabetics should only drink water.   ____Ensure clear carbohydrate drink on the way to the hospital for bariatric patients  ____Ensure clear carbohydrate drink 3 hours before surgery for Dr Rutherford Nail patients if physician instructed.   No gum chewing or hard candies.     __x__ 2. No Alcohol for 24 hours before or after surgery.   __x__3. No Smoking or e-cigarettes for 24 prior to surgery.  Do not use any chewable tobacco products for at least 6 hour prior to surgery   ____  4. Bring all medications with you on the day of surgery if instructed.    __x__ 5. Notify your doctor if there is any change in your medical condition     (cold, fever, infections).    x___6. On the morning of surgery brush your teeth with toothpaste and water.  You may rinse your mouth with mouth wash if you wish.  Do not swallow any toothpaste or mouthwash.   Do not wear jewelry, make-up, hairpins, clips or nail polish.  Do not wear lotions, powders, or perfumes. You  may wear deodorant.  Do not shave 48 hours prior to surgery. Men may shave face and neck.  Do not bring valuables to the hospital.    [facility] is not responsible for any belongings or valuables.               Contacts, dentures or bridgework may not be worn into surgery.  Leave your suitcase in the car. After surgery it may be brought to your room.  For patients admitted to the hospital, discharge time is determined by your                       treatment team.  _  Patients discharged the day of surgery will not be allowed to drive home.  You will need someone to drive you home and stay with you the night of your procedure.    Please read over the following fact sheets that you were given:   [facility] Preparing for Surgery and or MRSA Information   _x___ Take anti-hypertensive listed below, cardiac, seizure, asthma,     anti-reflux and psychiatric medicines. These include:  1. gabapentin (NEURONTIN) 300 MG capsule  2.OMEPRAZOLE PO tonight and in am  3.Cyclobenzaprine HCl (FLEXERIL PO)  4.  5.  6.  ____Fleets enema or Magnesium Citrate as directed.  _x___ Use CHG Soap or sage wipes as directed on instruction sheet   ____ Use inhalers on the day of surgery and bring to hospital day of surgery  ____ Stop Metformin and Janumet 2 days prior to surgery.    ____ Take 1/2 of usual insulin dose the night before surgery and none on the morning     surgery.   _x___ Follow recommendations from Cardiologist, Pulmonologist or PCP regarding          stopping Aspirin, Coumadin, Plavix ,Eliquis, Effient, or Pradaxa, and Pletal.  X____Stop Anti-inflammatories such as Advil, Aleve, Ibuprofen, Motrin, Naproxen, Naprosyn, Goodies powders or aspirin products. OK to take Tylenol and                          Celebrex.   _x___ Stop supplements until after surgery.  But may continue Vitamin D, Vitamin B,       and multivitamin.   ____ Bring C-Pap to the hospital.    

## 2019-01-02 ENCOUNTER — Ambulatory Visit
Admission: RE | Admit: 2019-01-02 | Discharge: 2019-01-02 | Disposition: A | Discharge status: Home / Self Care | Payer: BC Managed Care – PPO | Attending: Neurosurgery | Admitting: Neurosurgery

## 2019-01-02 ENCOUNTER — Ambulatory Visit: Payer: BC Managed Care – PPO | Admitting: Anesthesiology

## 2019-01-02 ENCOUNTER — Encounter
Admission: RE | Disposition: A | Discharge status: Home / Self Care | Payer: Self-pay | Source: Home / Self Care | Attending: Neurosurgery

## 2019-01-02 ENCOUNTER — Ambulatory Visit: Payer: BC Managed Care – PPO

## 2019-01-02 ENCOUNTER — Other Ambulatory Visit: Payer: Self-pay

## 2019-01-02 ENCOUNTER — Encounter: Payer: Self-pay | Admitting: *Deleted

## 2019-01-02 DIAGNOSIS — I10 Essential (primary) hypertension: Secondary | ICD-10-CM | POA: Insufficient documentation

## 2019-01-02 DIAGNOSIS — M5412 Radiculopathy, cervical region: Secondary | ICD-10-CM | POA: Insufficient documentation

## 2019-01-02 DIAGNOSIS — Z833 Family history of diabetes mellitus: Secondary | ICD-10-CM | POA: Diagnosis not present

## 2019-01-02 DIAGNOSIS — Z79899 Other long term (current) drug therapy: Secondary | ICD-10-CM | POA: Diagnosis not present

## 2019-01-02 DIAGNOSIS — Z91030 Bee allergy status: Secondary | ICD-10-CM | POA: Diagnosis not present

## 2019-01-02 DIAGNOSIS — M5136 Other intervertebral disc degeneration, lumbar region: Secondary | ICD-10-CM | POA: Diagnosis not present

## 2019-01-02 DIAGNOSIS — K219 Gastro-esophageal reflux disease without esophagitis: Secondary | ICD-10-CM | POA: Diagnosis not present

## 2019-01-02 DIAGNOSIS — R51 Headache: Secondary | ICD-10-CM | POA: Diagnosis not present

## 2019-01-02 DIAGNOSIS — I209 Angina pectoris, unspecified: Secondary | ICD-10-CM | POA: Insufficient documentation

## 2019-01-02 DIAGNOSIS — Z8249 Family history of ischemic heart disease and other diseases of the circulatory system: Secondary | ICD-10-CM | POA: Insufficient documentation

## 2019-01-02 DIAGNOSIS — G709 Myoneural disorder, unspecified: Secondary | ICD-10-CM | POA: Diagnosis not present

## 2019-01-02 DIAGNOSIS — Z981 Arthrodesis status: Secondary | ICD-10-CM

## 2019-01-02 DIAGNOSIS — Z419 Encounter for procedure for purposes other than remedying health state, unspecified: Secondary | ICD-10-CM

## 2019-01-02 HISTORY — PX: CERVICAL DISC ARTHROPLASTY: SHX587

## 2019-01-02 SURGERY — CERVICAL ANTERIOR DISC ARTHROPLASTY
Anesthesia: General | Site: Neck

## 2019-01-02 MED ORDER — FENTANYL CITRATE (PF) 100 MCG/2ML IJ SOLN
INTRAMUSCULAR | Status: AC
  Filled 2019-01-02: qty 2

## 2019-01-02 MED ORDER — OXYCODONE HCL 5 MG PO TABS: 5.0000 mg | ORAL_TABLET | ORAL | 0 refills | Status: DC | PRN

## 2019-01-02 MED ORDER — LACTATED RINGERS IV SOLN
INTRAVENOUS | Status: DC | PRN
  Administered 2019-01-02: 11:00:00 via INTRAVENOUS

## 2019-01-02 MED ORDER — FENTANYL CITRATE (PF) 100 MCG/2ML IJ SOLN
25.0000 ug | INTRAMUSCULAR | Status: DC | PRN
  Administered 2019-01-02 (×3): 25 ug via INTRAVENOUS

## 2019-01-02 MED ORDER — EPHEDRINE SULFATE 50 MG/ML IJ SOLN
INTRAMUSCULAR | Status: AC
  Filled 2019-01-02: qty 1

## 2019-01-02 MED ORDER — FENTANYL CITRATE (PF) 100 MCG/2ML IJ SOLN
INTRAMUSCULAR | Status: DC | PRN
  Administered 2019-01-02 (×2): 50 ug via INTRAVENOUS

## 2019-01-02 MED ORDER — METHOCARBAMOL 500 MG PO TABS: 500.0000 mg | ORAL_TABLET | Freq: Four times a day (QID) | ORAL | 0 refills | Status: DC | PRN

## 2019-01-02 MED ORDER — ROCURONIUM BROMIDE 50 MG/5ML IV SOLN
INTRAVENOUS | Status: AC
  Filled 2019-01-02: qty 1

## 2019-01-02 MED ORDER — ACETAMINOPHEN 10 MG/ML IV SOLN
INTRAVENOUS | Status: DC | PRN
  Administered 2019-01-02: 1000 mg via INTRAVENOUS

## 2019-01-02 MED ORDER — EPINEPHRINE PF 1 MG/ML IJ SOLN
INTRAMUSCULAR | Status: AC
  Filled 2019-01-02: qty 1

## 2019-01-02 MED ORDER — SUCCINYLCHOLINE CHLORIDE 20 MG/ML IJ SOLN
INTRAMUSCULAR | Status: DC | PRN
  Administered 2019-01-02: 100 mg via INTRAVENOUS

## 2019-01-02 MED ORDER — ACETAMINOPHEN 10 MG/ML IV SOLN
INTRAVENOUS | Status: AC
  Filled 2019-01-02: qty 100

## 2019-01-02 MED ORDER — ONDANSETRON HCL 4 MG/2ML IJ SOLN
INTRAMUSCULAR | Status: AC
  Filled 2019-01-02: qty 2

## 2019-01-02 MED ORDER — KETAMINE HCL 50 MG/ML IJ SOLN
INTRAMUSCULAR | Status: DC | PRN
  Administered 2019-01-02: 50 mg via INTRAMUSCULAR

## 2019-01-02 MED ORDER — BUPIVACAINE-EPINEPHRINE (PF) 0.5% -1:200000 IJ SOLN
INTRAMUSCULAR | Status: DC | PRN
  Administered 2019-01-02: 6 mL

## 2019-01-02 MED ORDER — MIDAZOLAM HCL 2 MG/2ML IJ SOLN
1.0000 mg | Freq: Once | INTRAMUSCULAR | Status: AC
  Administered 2019-01-02: 1 mg via INTRAVENOUS

## 2019-01-02 MED ORDER — REMIFENTANIL HCL 1 MG IV SOLR
INTRAVENOUS | Status: DC | PRN
  Administered 2019-01-02: .1 ug/kg/min via INTRAVENOUS

## 2019-01-02 MED ORDER — DEXAMETHASONE SODIUM PHOSPHATE 10 MG/ML IJ SOLN
INTRAMUSCULAR | Status: AC
  Filled 2019-01-02: qty 1

## 2019-01-02 MED ORDER — FENTANYL CITRATE (PF) 100 MCG/2ML IJ SOLN
INTRAMUSCULAR | Status: AC
  Administered 2019-01-02: 25 ug via INTRAVENOUS
  Filled 2019-01-02: qty 2

## 2019-01-02 MED ORDER — CEFAZOLIN SODIUM-DEXTROSE 2-3 GM-%(50ML) IV SOLR
INTRAVENOUS | Status: DC | PRN
  Administered 2019-01-02: 2 g via INTRAVENOUS

## 2019-01-02 MED ORDER — ONDANSETRON HCL 4 MG/2ML IJ SOLN: 4.0000 mg | Freq: Once | INTRAMUSCULAR | Status: DC | PRN

## 2019-01-02 MED ORDER — ONDANSETRON HCL 4 MG/2ML IJ SOLN
INTRAMUSCULAR | Status: DC | PRN
  Administered 2019-01-02: 4 mg via INTRAVENOUS

## 2019-01-02 MED ORDER — MIDAZOLAM HCL 2 MG/2ML IJ SOLN
INTRAMUSCULAR | Status: AC
  Filled 2019-01-02: qty 2

## 2019-01-02 MED ORDER — CEFAZOLIN SODIUM 1 G IJ SOLR
INTRAMUSCULAR | Status: AC
  Filled 2019-01-02: qty 20

## 2019-01-02 MED ORDER — ROCURONIUM BROMIDE 100 MG/10ML IV SOLN
INTRAVENOUS | Status: DC | PRN
  Administered 2019-01-02: 10 mg via INTRAVENOUS

## 2019-01-02 MED ORDER — SUCCINYLCHOLINE CHLORIDE 20 MG/ML IJ SOLN
INTRAMUSCULAR | Status: AC
  Filled 2019-01-02: qty 1

## 2019-01-02 MED ORDER — HYDROMORPHONE HCL 1 MG/ML IJ SOLN
0.2500 mg | INTRAMUSCULAR | Status: DC | PRN
  Administered 2019-01-02: 0.25 mg via INTRAVENOUS

## 2019-01-02 MED ORDER — SODIUM CHLORIDE 0.9 % IR SOLN
Status: DC | PRN
  Administered 2019-01-02: 1000 mL

## 2019-01-02 MED ORDER — SODIUM CHLORIDE 0.9 % IV SOLN
INTRAVENOUS | Status: DC | PRN
  Administered 2019-01-02: 30 ug/min via INTRAVENOUS

## 2019-01-02 MED ORDER — LIDOCAINE HCL 4 % MT SOLN
OROMUCOSAL | Status: DC | PRN
  Administered 2019-01-02: 4 mL via TOPICAL

## 2019-01-02 MED ORDER — MIDAZOLAM HCL 2 MG/2ML IJ SOLN
INTRAMUSCULAR | Status: DC | PRN
  Administered 2019-01-02: 2 mg via INTRAVENOUS

## 2019-01-02 MED ORDER — THROMBIN 5000 UNITS EX SOLR
CUTANEOUS | Status: AC
  Filled 2019-01-02: qty 5000

## 2019-01-02 MED ORDER — THROMBIN 5000 UNITS EX SOLR
CUTANEOUS | Status: DC | PRN
  Administered 2019-01-02: 5000 [IU] via TOPICAL

## 2019-01-02 MED ORDER — LIDOCAINE HCL (PF) 2 % IJ SOLN
INTRAMUSCULAR | Status: AC
  Filled 2019-01-02: qty 10

## 2019-01-02 MED ORDER — PHENYLEPHRINE HCL 10 MG/ML IJ SOLN
INTRAMUSCULAR | Status: AC
  Filled 2019-01-02: qty 1

## 2019-01-02 MED ORDER — BUPIVACAINE HCL (PF) 0.5 % IJ SOLN
INTRAMUSCULAR | Status: AC
  Filled 2019-01-02: qty 30

## 2019-01-02 MED ORDER — FAMOTIDINE 20 MG PO TABS
ORAL_TABLET | ORAL | Status: AC
  Administered 2019-01-02: 20 mg via ORAL
  Filled 2019-01-02: qty 1

## 2019-01-02 MED ORDER — OXYCODONE HCL 5 MG PO TABS
5.0000 mg | ORAL_TABLET | Freq: Once | ORAL | Status: AC
  Administered 2019-01-02: 5 mg via ORAL

## 2019-01-02 MED ORDER — EPHEDRINE SULFATE 50 MG/ML IJ SOLN
INTRAMUSCULAR | Status: DC | PRN
  Administered 2019-01-02: 10 mg via INTRAVENOUS

## 2019-01-02 MED ORDER — LIDOCAINE HCL (CARDIAC) PF 100 MG/5ML IV SOSY
PREFILLED_SYRINGE | INTRAVENOUS | Status: DC | PRN
  Administered 2019-01-02: 100 mg via INTRAVENOUS

## 2019-01-02 MED ORDER — LACTATED RINGERS IV SOLN
INTRAVENOUS | Status: DC
  Administered 2019-01-02: 10:00:00 via INTRAVENOUS

## 2019-01-02 MED ORDER — DEXAMETHASONE SODIUM PHOSPHATE 10 MG/ML IJ SOLN
INTRAMUSCULAR | Status: DC | PRN
  Administered 2019-01-02: 10 mg via INTRAVENOUS

## 2019-01-02 MED ORDER — PHENYLEPHRINE HCL 10 MG/ML IJ SOLN
INTRAMUSCULAR | Status: DC | PRN
  Administered 2019-01-02 (×3): 100 ug via INTRAVENOUS

## 2019-01-02 MED ORDER — REMIFENTANIL HCL 1 MG IV SOLR
INTRAVENOUS | Status: AC
  Filled 2019-01-02: qty 1000

## 2019-01-02 MED ORDER — FAMOTIDINE 20 MG PO TABS
20.0000 mg | ORAL_TABLET | Freq: Once | ORAL | Status: AC
  Administered 2019-01-02: 20 mg via ORAL

## 2019-01-02 MED ORDER — HYDROMORPHONE HCL 1 MG/ML IJ SOLN
INTRAMUSCULAR | Status: AC
  Administered 2019-01-02: 0.25 mg via INTRAVENOUS
  Filled 2019-01-02: qty 1

## 2019-01-02 MED ORDER — PROPOFOL 10 MG/ML IV BOLUS
INTRAVENOUS | Status: DC | PRN
  Administered 2019-01-02: 200 mg via INTRAVENOUS

## 2019-01-02 MED ORDER — SODIUM CHLORIDE FLUSH 0.9 % IV SOLN
INTRAVENOUS | Status: AC
  Filled 2019-01-02: qty 10

## 2019-01-02 MED ORDER — PROPOFOL 10 MG/ML IV BOLUS
INTRAVENOUS | Status: AC
  Filled 2019-01-02: qty 20

## 2019-01-02 MED ORDER — BACITRACIN 50000 UNITS IM SOLR
INTRAMUSCULAR | Status: AC
  Filled 2019-01-02: qty 1

## 2019-01-02 MED ORDER — SODIUM CHLORIDE (PF) 0.9 % IJ SOLN
INTRAMUSCULAR | Status: AC
  Filled 2019-01-02: qty 20

## 2019-01-02 MED ORDER — OXYCODONE HCL 5 MG PO TABS
ORAL_TABLET | ORAL | Status: AC
  Filled 2019-01-02: qty 1

## 2019-01-02 SURGICAL SUPPLY — 52 items
BUR NEURO DRILL SOFT 3.0X3.8M (BURR) ×3 IMPLANT
CANISTER SUCT 1200ML W/VALVE (MISCELLANEOUS) ×6 IMPLANT
CHLORAPREP W/TINT 26ML (MISCELLANEOUS) ×6 IMPLANT
COUNTER NEEDLE 20/40 LG (NEEDLE) ×3 IMPLANT
COVER LIGHT HANDLE STERIS (MISCELLANEOUS) ×6 IMPLANT
COVER WAND RF STERILE (DRAPES) ×3 IMPLANT
CRADLE LAMINECT ARM (MISCELLANEOUS) ×3 IMPLANT
DERMABOND ADVANCED (GAUZE/BANDAGES/DRESSINGS) ×2
DERMABOND ADVANCED .7 DNX12 (GAUZE/BANDAGES/DRESSINGS) ×1 IMPLANT
DISC MOBI-C CERVICAL 15X17 H5 (Miscellaneous) ×3 IMPLANT
DRAPE C-ARM 42X72 X-RAY (DRAPES) ×6 IMPLANT
DRAPE LAPAROTOMY 77X122 PED (DRAPES) ×3 IMPLANT
DRAPE MICROSCOPE SPINE 48X150 (DRAPES) ×3 IMPLANT
DRAPE POUCH INSTRU U-SHP 10X18 (DRAPES) ×3 IMPLANT
DRAPE SURG 17X11 SM STRL (DRAPES) ×12 IMPLANT
ELECT CAUTERY BLADE TIP 2.5 (TIP) ×3
ELECT REM PT RETURN 9FT ADLT (ELECTROSURGICAL) ×3
ELECTRODE CAUTERY BLDE TIP 2.5 (TIP) ×1 IMPLANT
ELECTRODE REM PT RTRN 9FT ADLT (ELECTROSURGICAL) ×1 IMPLANT
FEE INTRAOP MONITOR IMPULS NCS (MISCELLANEOUS) IMPLANT
FRAME EYE SHIELD (PROTECTIVE WEAR) ×6 IMPLANT
GLOVE BIOGEL PI IND STRL 7.0 (GLOVE) ×1 IMPLANT
GLOVE BIOGEL PI INDICATOR 7.0 (GLOVE) ×2
GLOVE SURG SYN 7.0 (GLOVE) ×6 IMPLANT
GLOVE SURG SYN 8.5  E (GLOVE) ×6
GLOVE SURG SYN 8.5 E (GLOVE) ×3 IMPLANT
GOWN SRG XL LVL 3 NONREINFORCE (GOWNS) ×1 IMPLANT
GOWN STRL NON-REIN TWL XL LVL3 (GOWNS) ×2
GOWN STRL REUS W/TWL MED LVL3 (GOWN DISPOSABLE) ×3 IMPLANT
GRADUATE 1200CC STRL 31836 (MISCELLANEOUS) ×3 IMPLANT
INTRAOP MONITOR FEE IMPULS NCS (MISCELLANEOUS)
INTRAOP MONITOR FEE IMPULSE (MISCELLANEOUS)
KIT TURNOVER KIT A (KITS) ×3 IMPLANT
MARKER SKIN DUAL TIP RULER LAB (MISCELLANEOUS) ×3 IMPLANT
NDL SAFETY ECLIPSE 18X1.5 (NEEDLE) IMPLANT
NEEDLE HYPO 18GX1.5 SHARP (NEEDLE)
NEEDLE HYPO 22GX1.5 SAFETY (NEEDLE) ×3 IMPLANT
NS IRRIG 1000ML POUR BTL (IV SOLUTION) ×3 IMPLANT
PACK LAMINECTOMY NEURO (CUSTOM PROCEDURE TRAY) ×3 IMPLANT
PIN CASPAR SPINAL 14MM CERVICA (PIN) ×3 IMPLANT
SPOGE SURGIFLO 8M (HEMOSTASIS) ×2
SPONGE KITTNER 5P (MISCELLANEOUS) ×3 IMPLANT
SPONGE SURGIFLO 8M (HEMOSTASIS) ×1 IMPLANT
STAPLER SKIN PROX 35W (STAPLE) IMPLANT
SUT V-LOC 90 ABS DVC 3-0 CL (SUTURE) ×3 IMPLANT
SUT VIC AB 3-0 SH 8-18 (SUTURE) ×3 IMPLANT
SYR 30ML LL (SYRINGE) ×3 IMPLANT
TAPE CLOTH 3X10 WHT NS LF (GAUZE/BANDAGES/DRESSINGS) ×3 IMPLANT
TOWEL OR 17X26 4PK STRL BLUE (TOWEL DISPOSABLE) ×9 IMPLANT
TRAY FOLEY MTR SLVR 16FR STAT (SET/KITS/TRAYS/PACK) IMPLANT
TUBING CONNECTING 10 (TUBING) ×2 IMPLANT
TUBING CONNECTING 10' (TUBING) ×1

## 2019-01-02 NOTE — Anesthesia Post-op Follow-up Note (Signed)
Anesthesia QCDR form completed.        

## 2019-01-02 NOTE — Anesthesia Preprocedure Evaluation (Signed)
Anesthesia Evaluation  Patient identified by MRN, date of birth, ID band Patient awake    Reviewed: Allergy & Precautions, H&P , NPO status , Patient's Chart, lab work & pertinent test results, reviewed documented beta blocker date and time   Airway Mallampati: II  TM Distance: >3 FB Neck ROM: full    Dental  (+) Teeth Intact   Pulmonary neg pulmonary ROS,    Pulmonary exam normal        Cardiovascular Exercise Tolerance: Good hypertension, On Medications (-) anginaNormal cardiovascular exam Rhythm:regular Rate:Normal     Neuro/Psych  Neuromuscular disease negative psych ROS   GI/Hepatic Neg liver ROS, GERD  Medicated,  Endo/Other  negative endocrine ROS  Renal/GU negative Renal ROS  negative genitourinary   Musculoskeletal   Abdominal   Peds  Hematology negative hematology ROS (+)   Anesthesia Other Findings Past Medical History: No date: Anginal pain (HCC) No date: Arm pain     Comment:  neck shoulder and arm pain No date: DJD (degenerative joint disease) No date: GERD (gastroesophageal reflux disease) No date: H/O degenerative disc disease No date: Hypertension     Comment:  borderline No date: Rhinitis No date: Tinea Past Surgical History: 12/01/2017: ESOPHAGOGASTRODUODENOSCOPY (EGD) WITH PROPOFOL; N/A     Comment:  Procedure: ESOPHAGOGASTRODUODENOSCOPY (EGD) WITH               PROPOFOL;  Surgeon: Christena Deem, MD;  Location:               [facility] ENDOSCOPY;  Service: Endoscopy;  Laterality: N/A; No date: VASECTOMY No date: VASECTOMY No date: WISDOM TOOTH EXTRACTION BMI    Body Mass Index:  30.68 kg/m     Reproductive/Obstetrics negative OB ROS                             Anesthesia Physical Anesthesia Plan  ASA: II  Anesthesia Plan: General ETT   Post-op Pain Management:    Induction:   PONV Risk Score and Plan: 3  Airway Management Planned:   Additional  Equipment:   Intra-op Plan:   Post-operative Plan:   Informed Consent: I have reviewed the patients History and Physical, chart, labs and discussed the procedure including the risks, benefits and alternatives for the proposed anesthesia with the patient or authorized representative who has indicated his/her understanding and acceptance.     Dental Advisory Given  Plan Discussed with: CRNA  Anesthesia Plan Comments:         Anesthesia Quick Evaluation

## 2019-01-02 NOTE — Progress Notes (Signed)
Moving all legs and arms

## 2019-01-02 NOTE — H&P (Signed)
I have reviewed and confirmed my history and physical from 12/27/2018 with no additions or changes. Plan for arthroplasty C6-7.  Risks and benefits reviewed.  Heart sounds normal no MRG. Chest Clear to Auscultation Bilaterally.

## 2019-01-02 NOTE — Anesthesia Procedure Notes (Signed)
Procedure Name: Intubation Date/Time: 01/02/2019 11:13 AM Performed by: Eben Burow, CRNA Pre-anesthesia Checklist: Patient identified, Emergency Drugs available, Suction available, Patient being monitored and Timeout performed Patient Re-evaluated:Patient Re-evaluated prior to induction Oxygen Delivery Method: Circle system utilized Preoxygenation: Pre-oxygenation with 100% oxygen Induction Type: IV induction Ventilation: Mask ventilation without difficulty Laryngoscope Size: McGraph and 4 Grade View: Grade I Tube type: Oral Tube size: 7.5 mm Number of attempts: 1 Airway Equipment and Method: Stylet,  Video-laryngoscopy and LTA kit utilized Placement Confirmation: positive ETCO2 and breath sounds checked- equal and bilateral Secured at: 23 cm Tube secured with: Tape Dental Injury: Teeth and Oropharynx as per pre-operative assessment

## 2019-01-02 NOTE — Progress Notes (Signed)
Belching noted  Swallowing well with ice chips

## 2019-01-02 NOTE — Transfer of Care (Signed)
Immediate Anesthesia Transfer of Care Note  Patient: Kyle Fields  Procedure(s) Performed: C6-7 ARTHROPLASTY (N/A Neck)  Patient Location: PACU  Anesthesia Type:General  Level of Consciousness: drowsy  Airway & Oxygen Therapy: Patient Spontanous Breathing and Patient connected to face mask oxygen  Post-op Assessment: Report given to RN and Post -op Vital signs reviewed and stable  Post vital signs: Reviewed and stable  Last Vitals:  Vitals Value Taken Time  BP 124/78 01/02/2019  1:13 PM  Temp 36.2 C 01/02/2019  1:13 PM  Pulse 94 01/02/2019  1:17 PM  Resp 16 01/02/2019  1:17 PM  SpO2 100 % 01/02/2019  1:17 PM  Vitals shown include unvalidated device data.  Last Pain:  Vitals:   01/02/19 0912  TempSrc: Oral  PainSc: 0-No pain         Complications: No apparent anesthesia complications

## 2019-01-02 NOTE — Discharge Summary (Signed)
Procedure: C6-7 arthroplasty  Procedure date: 01/02/2019 Diagnosis: Cervical radiculopathy  History: Kyle Fields is s/p C6-7 arthroplasty for cervical radiculopathy.  Tolerated procedure well. Upper extremity symptoms that were present prior to surgery have resolved. Complains of posterior cervical pain that has been adequately controlled. No new pain/numbness/tingling in upper or lower extremities. Voided and ambulated without issue. No issues with swallowing.    Physical Exam: Vitals:   01/02/19 0912  BP: (!) 140/107  Pulse: (!) 119  Temp: 97.8 F (36.6 C)  SpO2: 100%    AA Ox3 Strength:5/5 throughout upper and lower extremities. Sensation: intact and symmetric upper and lower extremities.  Skin: Glue intact at incision site  Data:  Recent Labs  Lab 01/01/19 1310  NA 140  K 3.8  CL 108  CO2 28  BUN 16  CREATININE 1.39*  GLUCOSE 83  CALCIUM 9.3   No results for input(s): AST, ALT, ALKPHOS in the last 168 hours.  Invalid input(s): TBILI   Recent Labs  Lab 01/01/19 1310  WBC 3.9*  HGB 15.1  HCT 44.8  PLT 198   Recent Labs  Lab 01/01/19 1310  APTT 34  INR 1.07         Other tests/results:   CERVICAL SPINE - 2-3 VIEW 01/02/2019  COMPARISON:  Fluoroscopic images of same day.  FINDINGS: Status post C6-7 disc space replacement. Good alignment of vertebral bodies is noted. No fracture or spondylolisthesis is noted. Mild degenerative disc disease is also noted at C3-4 and C5-6. Expected postoperative changes are noted in the prevertebral soft tissues.  IMPRESSION: Status post C6-7 disc space replacement.   Electronically Signed   By: Lupita Raider, M.D.   On: 01/02/2019 16:14  Assessment/Plan:  Kyle Fields is POD0 s/p cervical arthroplasty for cervical radiculopathy. Pain is adequately controlled. He has ambulated, voided without issue. Denies any swallowing difficulty. Discussed wound care and activity restrictions. He expressed  understanding. He is scheduled to follow up in clinic in approximately 2 weeks to monitor progress. Advised to contact office if questions or concerns arise before then.   Ivar Drape PA-C Department of Neurosurgery

## 2019-01-02 NOTE — Op Note (Signed)
Indications: Mr. Fidel is a 48 yo male who presented with cervical radiculopathy.  He failed conservative management and elected for surgical intervention.  Findings: neuroforaminal stenosis  Preoperative Diagnosis: Cervical radiculopathy Postoperative Diagnosis: same   EBL: 25 ml IVF: 700 ml Drains: none Disposition: Extubated and Stable to PACU Complications: none  No foley catheter was placed.   Preoperative Note:   Risks of surgery discussed include: infection, bleeding, stroke, coma, death, paralysis, CSF leak, nerve/spinal cord injury, numbness, tingling, weakness, complex regional pain syndrome, recurrent stenosis and/or disc herniation, vascular injury, development of instability, neck/back pain, need for further surgery, persistent symptoms, development of deformity, and the risks of anesthesia. The patient understood these risks and agreed to proceed.  Operative Note:  Procedure:  1) Cervical Disc Arthroplasty at C6/7 using a LDR Mobi-C device   Procedure: After obtaining informed consent, the patient taken to the operating room, placed in supine position, general anesthesia induced.  The patient had a small shoulder roll placed behind the neck.  The patient received preop antibiotics and IV Decadron.  The patient had a neck incision outlined, was prepped and draped in usual sterile fashion. The incision was injected with local anesthetic.   An incision was opened, dissection taken down medial to the carotid artery and jugular vein, lateral to the trachea and esophagus.  The prevertebral fascia identified and a localizing x-ray demonstrated the correct level.  The longus colli were dissected laterally, and self-retaining retractors placed to open the operative field. The microscope was then brought into the field.  With this complete, distractor pins were placed in the vertebral bodies of C6 and C7. The distractor was placed, and the anulus at C6/7 was opened using a bovie.   Curettes and pituitary rongeurs used to remove the majority of disk, then the drill was used to remove the posterior osteophyte and begin the foraminotomies. The nerve hook was used to elevate the posterior longitudinal ligament, which was then removed with Kerrison rongeurs. The microblunt nerve hook could be passed out the foramen bilaterally at each level.   Meticulous hemostasis was obtained.  A trial spacer was used to size the disc space. Using flouroscopic guidance, a 17 mm width x 15 mm depth x 5 mm height Mobi-C was then inserted in the prepared disc space.  The caspar distractor was removed, and bone wax used for hemostasis. Final AP and lateral radiographs were taken.   With the disc arthroplasty in good position, the wound was irrigated copiously with bacitracin-containing solution and meticulous hemostasis obtained.  Wound was closed in 2 layers using interrupted inverted 3-0 Vicryl sutures.  The wound was dressed with dermabond, the head of bed at 30 degrees, taken to recovery room in stable condition.  No new postop neurological deficits were identified.  Sponge and pattie counts were correct at the end of the procedure.     I performed the entire procedure with the assistance of Ivar Drape PA as an Designer, television/film set.  Venetia Night MD

## 2019-01-02 NOTE — Discharge Instructions (Addendum)
AMBULATORY SURGERY  DISCHARGE INSTRUCTIONS   1) The drugs that you were given will stay in your system until tomorrow so for the next 24 hours you should not:  A) Drive an automobile B) Make any legal decisions C) Drink any alcoholic beverage   2) You may resume regular meals tomorrow.  Today it is better to start with liquids and gradually work up to solid foods. Call for your appointment time please  You may eat anything you prefer, but it is better to start with liquids, then soup and crackers, and gradually work up to solid foods.   3) Please notify your doctor immediately if you have any unusual bleeding, trouble breathing, redness and pain at the surgery site, drainage, fever, or pain not relieved by medication.    4) Additional Instructions:        Please contact your physician with any problems or Same Day Surgery at [phone], Monday through Friday 6 am to 4 pm, or [place] at [facility] number at [phone].AMBULATORY SURGERY  DISCHARGE INSTRUCTIONS   5) The drugs that you were given will stay in your system until tomorrow so for the next 24 hours you should not:  D) Drive an automobile E) Make any legal decisions F) Drink any alcoholic beverage   6) You may resume regular meals tomorrow.  Today it is better to start with liquids and gradually work up to solid foods.  You may eat anything you prefer, but it is better to start with liquids, then soup and crackers, and gradually work up to solid foods.   7) Please notify your doctor immediately if you have any unusual bleeding, trouble breathing, redness and pain at the surgery site, drainage, fever, or pain not relieved by medication.    8) Additional Instructions:        Please contact your physician with any problems or Same Day Surgery at [phone], Monday through Friday 6 am to 4 pm, or [place] at [facility] number at [phone]. Your surgeon has performed an operation on  your cervical spine (neck) to relieve pressure on the spinal cord and/or nerves. This involved making an incision in the front of your neck and removing one or more of the discs that support your spine. Next, a small piece of bone, a titanium plate, and screws were used to fuse two or more of the vertebrae (bones) together.  The following are instructions to help in your recovery once you have been discharged from the hospital. Even if you feel well, it is important that you follow these activity guidelines. If you do not let your neck heal properly from the surgery, you can increase the chance of return of your symptoms and other complications.  * Do not take anti-inflammatory medications for 3 months after surgery (naproxen [Aleve], ibuprofen [Advil, Motrin], celecoxib [Celebrex], etc.). These medications can prevent your bones from healing properly.  Activity    No bending, lifting, or twisting (BLT). Avoid lifting objects heavier than 10 pounds (gallon milk jug).  Where possible, avoid household activities that involve lifting, bending, reaching, pushing, or pulling such as laundry, vacuuming, grocery shopping, and childcare. Try to arrange for help from friends and family for these activities while your back heals.  Increase physical activity slowly as tolerated.  Taking short walks is encouraged, but avoid strenuous exercise. Do not jog, run, bicycle, lift weights, or participate in any other exercises unless specifically allowed by your doctor.  Talk to your doctor before resuming  sexual activity.  You should not drive until cleared by your doctor.  Until released by your doctor, you should not return to work or school.  You should rest at home and let your body heal.   You may shower three days after your surgery.  After showering, lightly dab your incision dry. Do not take a tub bath or go swimming until approved by your doctor at your follow-up appointment.  If your doctor ordered a  cervical collar (neck brace) for you, you should wear it whenever you are out of bed. You may remove it when lying down or sleeping, but you should wear it at all other times. Not all neck surgeries require a cervical collar.  If you smoke, we strongly recommend that you quit.  Smoking has been proven to interfere with normal bone healing and will dramatically reduce the success rate of your surgery. Please contact [facility] (800-QUIT-NOW) and use the resources at www.[facility].com for assistance in stopping smoking.  Surgical Incision   If you have a dressing on your incision, you may remove it two days after your surgery. Keep your incision area clean and dry.  If you have staples or stitches on your incision, you should have a follow up scheduled for removal. If you do not have staples or stitches, you will have steri-strips (small pieces of surgical tape) or Dermabond glue. The steri-strips/glue should begin to peel away within about a week (it is fine if the steri-strips fall off before then). If the strips are still in place one week after your surgery, you may gently remove them.  Diet           You may return to your usual diet. However, you may experience discomfort when swallowing in the first month after your surgery. This is normal. You may find that softer foods are more comfortable for you to swallow. Be sure to stay hydrated.  When to Contact us  You may experience pain in your neck and/or pain between your shoulder blades. This is normal and should improve in the next few weeks with the help of pain medication, muscle relaxers, and rest. Some patients report that a warm compress on the back of the neck or between the shoulder blades helps.  However, should you experience any of the following, contact us immediately:  New numbness or weakness  Pain that is progressively getting worse, and is not relieved by your pain medication, muscle relaxers, rest, and warm  compresses  Bleeding, redness, swelling, pain, or drainage from surgical incision  Chills or flu-like symptoms  Fever greater than 101.0 F (38.3 C)  Inability to eat, drink fluids, or take medications  Problems with bowel or bladder functions  Difficulty breathing or shortness of breath  Warmth, tenderness, or swelling in your calf Contact Information  During office hours (Monday-Friday 9 am to 5 pm), please call your physician at [phone] and ask for Sharlot Gowda  After hours and weekends, please call the [facility] Operator at [phone] and ask for the Neurosurgery Resident On Call   For a life-threatening emergency, call 911

## 2019-01-02 NOTE — Progress Notes (Signed)
Pt has been OOB, ambulated to bathroom, tolerating pos , Marchelle Folks PA at bedside

## 2019-01-03 ENCOUNTER — Encounter: Payer: Self-pay | Admitting: Neurosurgery

## 2019-01-03 ENCOUNTER — Ambulatory Visit: Payer: BC Managed Care – PPO | Admitting: Student in an Organized Health Care Education/Training Program

## 2019-01-04 NOTE — Anesthesia Postprocedure Evaluation (Signed)
Anesthesia Post Note  Patient: Eldon Latter  Procedure(s) Performed: C6-7 ARTHROPLASTY (N/A Neck)  Patient location during evaluation: PACU Anesthesia Type: General Level of consciousness: awake and alert Pain management: pain level controlled Vital Signs Assessment: post-procedure vital signs reviewed and stable Respiratory status: spontaneous breathing, nonlabored ventilation, respiratory function stable and patient connected to nasal cannula oxygen Cardiovascular status: blood pressure returned to baseline and stable Postop Assessment: no apparent nausea or vomiting Anesthetic complications: no     Last Vitals:  Vitals:   01/02/19 1641 01/02/19 1824  BP: 127/87 (!) 133/93  Pulse: 79 94  Resp: 18 18  Temp:    SpO2: 100% 98%    Last Pain:  Vitals:   01/03/19 0840  TempSrc:   PainSc: 0-No pain                 Yevette Edwards

## 2020-04-15 IMAGING — MR MR CERVICAL SPINE W/O CM
5 series · 33 of 48 positions shown · non-contrast
Comparison: 10/31/2017

CLINICAL DATA: Chronic neck pain. Bilateral arm and hand numbness.
Weakness. Symptoms worse in the left arm.

EXAM:
MRI CERVICAL SPINE WITHOUT CONTRAST
TECHNIQUE: Multiplanar, multisequence MR imaging of the cervical spine was
performed. No intravenous contrast was administered.

[Series 2: T2 · sagittal · 3.0mm · 0.56mm/px · 6 of 13 slices shown (1 of 2)]
[im 1/13]
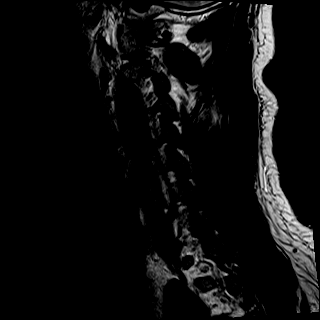
[im 3/13]
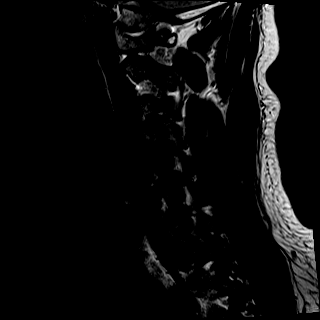
[im 5/13]
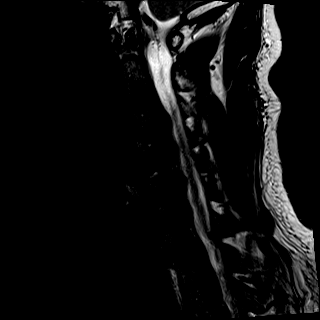
[im 8/13]
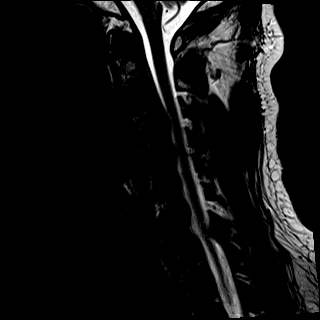
[im 10/13]
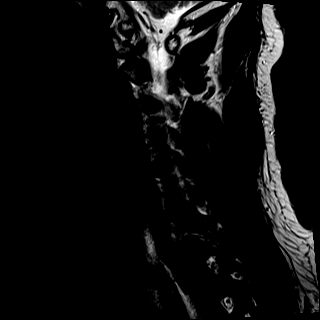
[im 13/13]
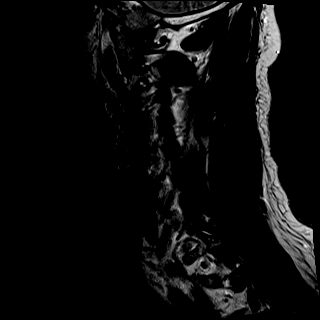

[Series 3: T1 · sagittal · 3.0mm · 0.70mm/px · 7 of 13 slices shown]
[im 1/13]
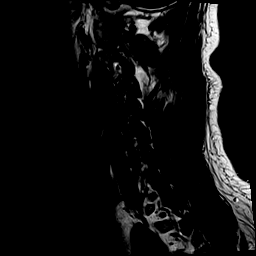
[im 3/13]
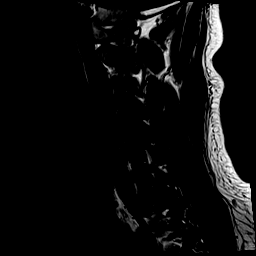
[im 5/13]
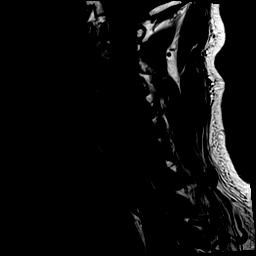
[im 7/13]
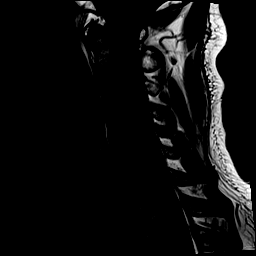
[im 9/13]
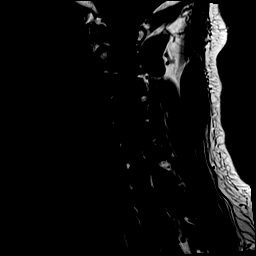
[im 11/13]
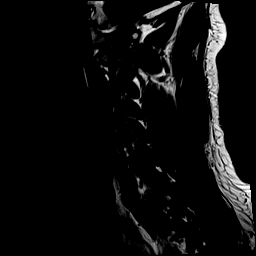
[im 13/13]
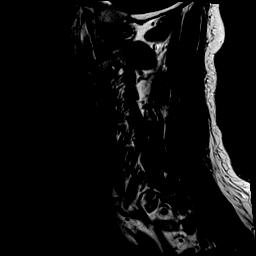

[Series 4: STIR · sagittal · 3.0mm · 0.35mm/px · 7 of 13 slices shown]
[im 1/13]
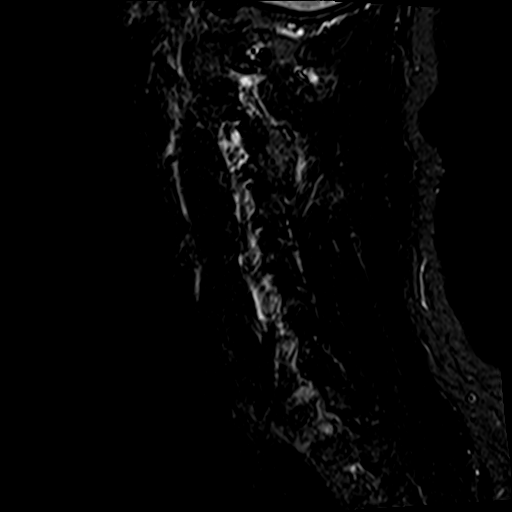
[im 3/13]
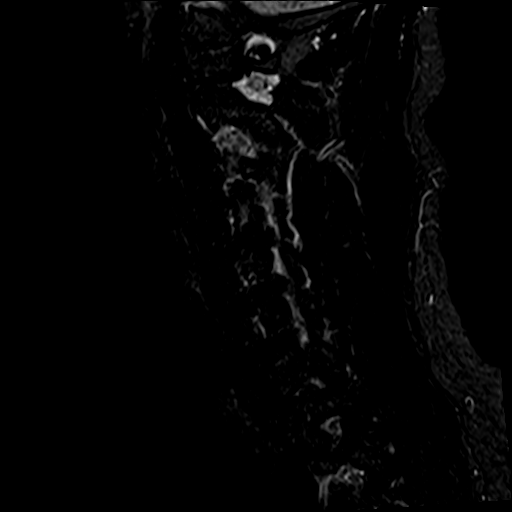
[im 5/13]
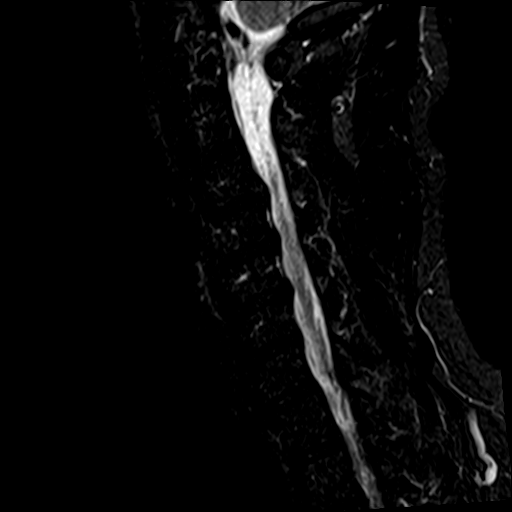
[im 7/13]
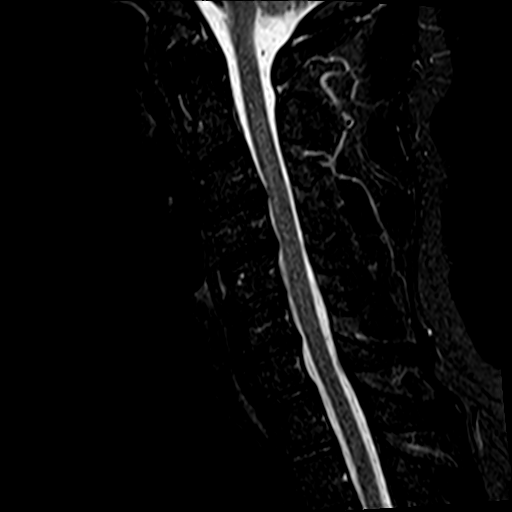
[im 9/13]
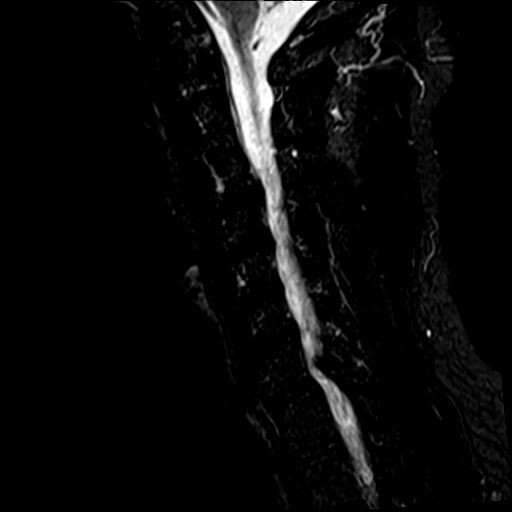
[im 11/13]
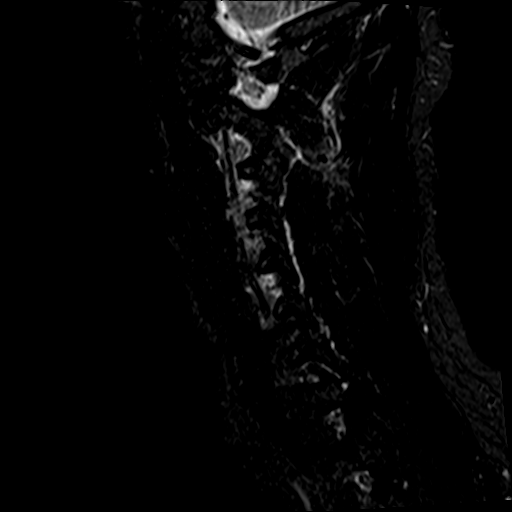
[im 13/13]
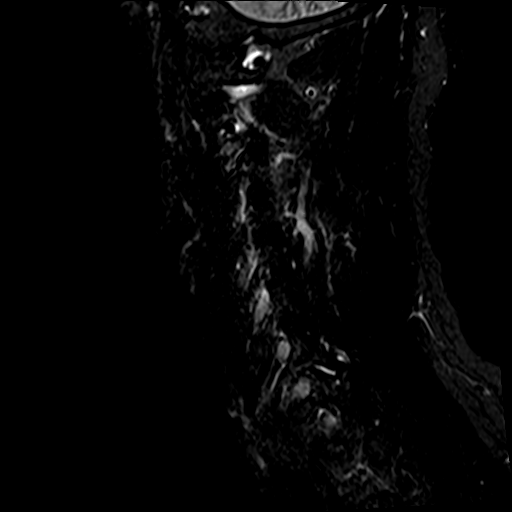

[Series 5: T2 · axial · 3.0mm · 0.62mm/px · z∈[-103,-6]mm · 8 of 28 slices shown (2 of 2)]
[im 1/28]
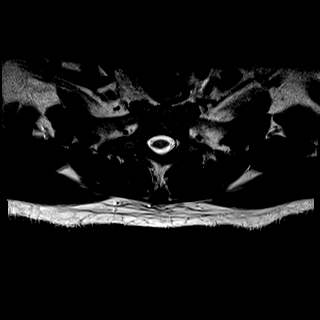
[im 5/28]
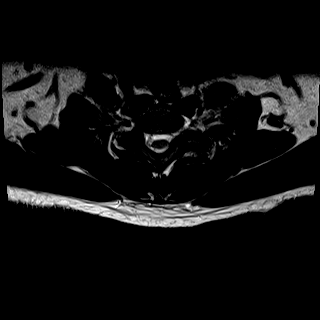
[im 9/28]
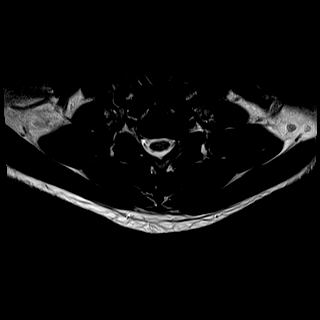
[im 13/28]
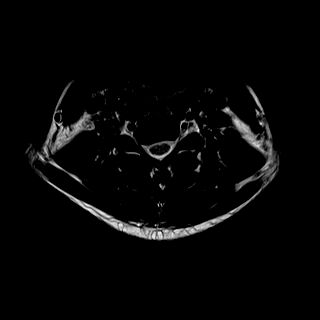
[im 15/28]
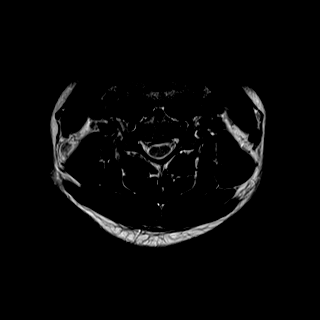
[im 19/28]
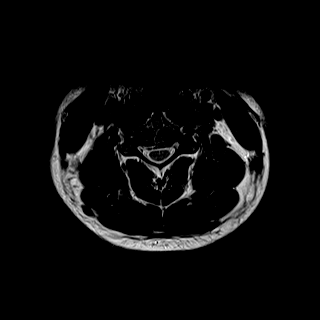
[im 23/28]
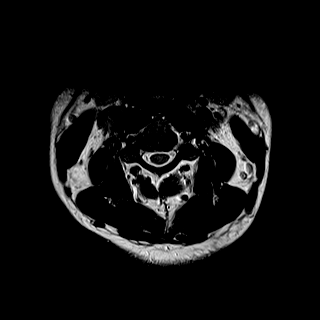
[im 28/28]
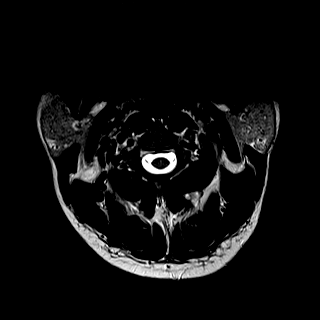

[Series 6: mpgr ax · axial · 3.0mm · 0.39mm/px · z∈[-94,-43]mm · 5 of 28 slices shown]
[im 1/28]
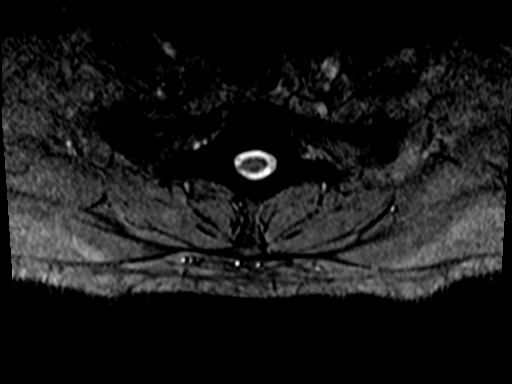
[im 5/28]
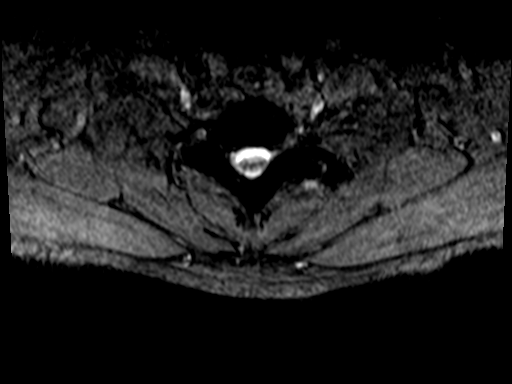
[im 9/28]
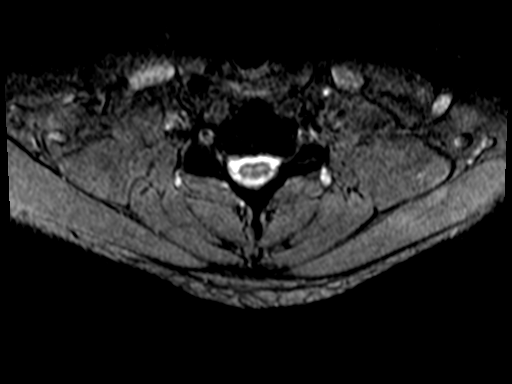
[im 13/28]
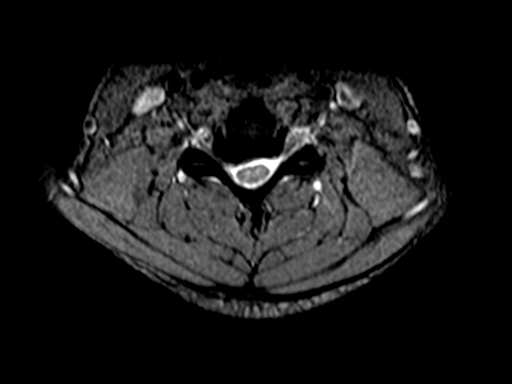
[im 15/28]
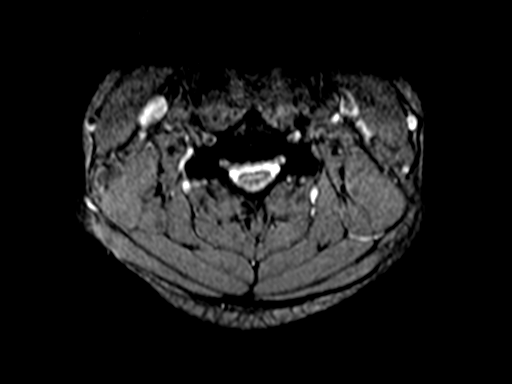

[33 of 48 positions shown; findings below may reference images not displayed]

FINDINGS: Alignment: Chronic cervical spine straightening. Unchanged trace
retrolisthesis of C3 on C4 and anterolisthesis of C7 on T1.

Vertebrae: No fracture, suspicious osseous lesion, or significant
marrow edema.

Cord: Normal signal and morphology.

Posterior Fossa, vertebral arteries, paraspinal tissues:
Unremarkable.

Disc levels:

C2-3: Negative.

C3-4: Unchanged disc space narrowing. Broad-based posterior disc
osteophyte complex slightly asymmetric to the right results in mild
spinal stenosis and moderate right neural foraminal stenosis,
unchanged.

C4-5: Broad-based posterior disc osteophyte complex asymmetric to
the right results in mild spinal stenosis and severe right and
moderate left neural foraminal stenosis, unchanged.

C5-6: Broad right paracentral to right foraminal disc protrusion and
right greater than left uncovertebral spurring result in borderline
right-sided spinal stenosis and moderate to severe right and mild
left neural foraminal stenosis, unchanged.

C6-7: Mild disc bulging and a prominent left foraminal disc
osteophyte complex result in severe left neural foraminal stenosis
without spinal stenosis, unchanged.

C7-T1: Trace anterolisthesis with disc uncovering and moderate right
and severe left facet arthrosis result in moderate bilateral neural
foraminal stenosis without spinal stenosis, unchanged. Small left
facet subchondral cysts are again noted.
IMPRESSION: 1. Unchanged cervical disc and facet degeneration resulting in up to
mild spinal stenosis as above.
2. Severe neural foraminal stenosis on the left at C6-7 and on the
right at C4-5 and C5-6.

## 2022-04-29 ENCOUNTER — Other Ambulatory Visit: Payer: Self-pay | Admitting: Podiatry

## 2022-05-04 ENCOUNTER — Encounter: Payer: Self-pay | Admitting: Podiatry

## 2022-05-11 ENCOUNTER — Ambulatory Visit: Payer: BC Managed Care – PPO | Admitting: Anesthesiology

## 2022-05-11 ENCOUNTER — Encounter: Payer: Self-pay | Admitting: Podiatry

## 2022-05-11 ENCOUNTER — Encounter
Admission: RE | Disposition: A | Discharge status: Home / Self Care | Payer: Self-pay | Source: Home / Self Care | Attending: Podiatry

## 2022-05-11 ENCOUNTER — Other Ambulatory Visit: Payer: Self-pay

## 2022-05-11 ENCOUNTER — Ambulatory Visit
Admission: RE | Admit: 2022-05-11 | Discharge: 2022-05-11 | Disposition: A | Discharge status: Home / Self Care | Payer: BC Managed Care – PPO | Attending: Podiatry | Admitting: Podiatry

## 2022-05-11 ENCOUNTER — Ambulatory Visit: Payer: Self-pay

## 2022-05-11 DIAGNOSIS — R2241 Localized swelling, mass and lump, right lower limb: Secondary | ICD-10-CM | POA: Diagnosis not present

## 2022-05-11 DIAGNOSIS — I10 Essential (primary) hypertension: Secondary | ICD-10-CM | POA: Diagnosis not present

## 2022-05-11 DIAGNOSIS — M2011 Hallux valgus (acquired), right foot: Secondary | ICD-10-CM | POA: Diagnosis not present

## 2022-05-11 DIAGNOSIS — K219 Gastro-esophageal reflux disease without esophagitis: Secondary | ICD-10-CM | POA: Insufficient documentation

## 2022-05-11 HISTORY — PX: WEIL OSTEOTOMY: SHX5044

## 2022-05-11 SURGERY — OSTEOTOMY, WEIL
Anesthesia: General | Site: Foot | Laterality: Right

## 2022-05-11 MED ORDER — LACTATED RINGERS IV SOLN: INTRAVENOUS | Status: DC

## 2022-05-11 MED ORDER — OXYCODONE-ACETAMINOPHEN 5-325 MG PO TABS: 1.0000 | ORAL_TABLET | ORAL | 0 refills | Status: DC | PRN

## 2022-05-11 MED ORDER — LIDOCAINE HCL (CARDIAC) PF 100 MG/5ML IV SOSY
PREFILLED_SYRINGE | INTRAVENOUS | Status: DC | PRN
  Administered 2022-05-11: 40 mg via INTRATRACHEAL

## 2022-05-11 MED ORDER — METOCLOPRAMIDE HCL 5 MG PO TABS: 5.0000 mg | ORAL_TABLET | Freq: Three times a day (TID) | ORAL | Status: DC | PRN

## 2022-05-11 MED ORDER — ONDANSETRON HCL 4 MG/2ML IJ SOLN
INTRAMUSCULAR | Status: DC | PRN
  Administered 2022-05-11: 4 mg via INTRAVENOUS

## 2022-05-11 MED ORDER — PROPOFOL 10 MG/ML IV BOLUS
INTRAVENOUS | Status: DC | PRN
  Administered 2022-05-11: 160 mg via INTRAVENOUS

## 2022-05-11 MED ORDER — ACETAMINOPHEN 160 MG/5ML PO SOLN: 325.0000 mg | ORAL | Status: DC | PRN

## 2022-05-11 MED ORDER — ACETAMINOPHEN 325 MG PO TABS: 325.0000 mg | ORAL_TABLET | ORAL | Status: DC | PRN

## 2022-05-11 MED ORDER — OXYCODONE HCL 5 MG PO TABS: 5.0000 mg | ORAL_TABLET | Freq: Once | ORAL | Status: DC | PRN

## 2022-05-11 MED ORDER — EPHEDRINE SULFATE (PRESSORS) 50 MG/ML IJ SOLN
INTRAMUSCULAR | Status: DC | PRN
  Administered 2022-05-11 (×2): 5 mg via INTRAVENOUS

## 2022-05-11 MED ORDER — MIDAZOLAM HCL 5 MG/5ML IJ SOLN
INTRAMUSCULAR | Status: DC | PRN
  Administered 2022-05-11: 2 mg via INTRAVENOUS

## 2022-05-11 MED ORDER — GLYCOPYRROLATE 0.2 MG/ML IJ SOLN
INTRAMUSCULAR | Status: DC | PRN
  Administered 2022-05-11: .1 mg via INTRAVENOUS

## 2022-05-11 MED ORDER — METOCLOPRAMIDE HCL 5 MG/ML IJ SOLN: 5.0000 mg | Freq: Three times a day (TID) | INTRAMUSCULAR | Status: DC | PRN

## 2022-05-11 MED ORDER — ONDANSETRON HCL 4 MG PO TABS: 4.0000 mg | ORAL_TABLET | Freq: Four times a day (QID) | ORAL | Status: DC | PRN

## 2022-05-11 MED ORDER — BUPIVACAINE HCL (PF) 0.25 % IJ SOLN
INTRAMUSCULAR | Status: DC | PRN
  Administered 2022-05-11: 10 mL

## 2022-05-11 MED ORDER — OXYCODONE HCL 5 MG/5ML PO SOLN: 5.0000 mg | Freq: Once | ORAL | Status: DC | PRN

## 2022-05-11 MED ORDER — SODIUM CHLORIDE 0.9 % IR SOLN
Status: DC | PRN
  Administered 2022-05-11: 500 mL

## 2022-05-11 MED ORDER — FENTANYL CITRATE (PF) 100 MCG/2ML IJ SOLN
INTRAMUSCULAR | Status: DC | PRN
Start: 2022-05-11 — End: 2022-05-11
  Administered 2022-05-11: 25 ug via INTRAVENOUS
  Administered 2022-05-11: 50 ug via INTRAVENOUS

## 2022-05-11 MED ORDER — ONDANSETRON HCL 4 MG/2ML IJ SOLN: 4.0000 mg | Freq: Four times a day (QID) | INTRAMUSCULAR | Status: DC | PRN

## 2022-05-11 MED ORDER — HYDROMORPHONE HCL 1 MG/ML IJ SOLN: 0.2500 mg | INTRAMUSCULAR | Status: DC | PRN

## 2022-05-11 MED ORDER — CEFAZOLIN SODIUM-DEXTROSE 2-4 GM/100ML-% IV SOLN
2.0000 g | INTRAVENOUS | Status: AC
  Administered 2022-05-11: 2 g via INTRAVENOUS

## 2022-05-11 MED ORDER — BUPIVACAINE LIPOSOME 1.3 % IJ SUSP
INTRAMUSCULAR | Status: DC | PRN
  Administered 2022-05-11 (×2): 10 mL

## 2022-05-11 MED ORDER — ONDANSETRON HCL 4 MG/2ML IJ SOLN: 4.0000 mg | Freq: Once | INTRAMUSCULAR | Status: DC | PRN

## 2022-05-11 MED ORDER — DEXAMETHASONE SODIUM PHOSPHATE 4 MG/ML IJ SOLN
INTRAMUSCULAR | Status: DC | PRN
  Administered 2022-05-11: 4 mg via INTRAVENOUS

## 2022-05-11 SURGICAL SUPPLY — 40 items
APL SKNCLS STERI-STRIP NONHPOA (GAUZE/BANDAGES/DRESSINGS) ×1
BENZOIN TINCTURE PRP APPL 2/3 (GAUZE/BANDAGES/DRESSINGS) ×2 IMPLANT
BLADE MED AGGRESSIVE (BLADE) ×1 IMPLANT
BNDG CMPR 75X41 PLY HI ABS (GAUZE/BANDAGES/DRESSINGS) ×1
BNDG COHESIVE 4X5 TAN ST LF (GAUZE/BANDAGES/DRESSINGS) ×2 IMPLANT
BNDG ELASTIC 4X5.8 VLCR STR LF (GAUZE/BANDAGES/DRESSINGS) ×2 IMPLANT
BNDG ESMARK 4X12 TAN STRL LF (GAUZE/BANDAGES/DRESSINGS) ×2 IMPLANT
BNDG STRETCH 4X75 STRL LF (GAUZE/BANDAGES/DRESSINGS) ×2 IMPLANT
CANISTER SUCT 1200ML W/VALVE (MISCELLANEOUS) ×2 IMPLANT
COVER LIGHT HANDLE UNIVERSAL (MISCELLANEOUS) ×4 IMPLANT
CUFF TOURN SGL QUICK 18X4 (TOURNIQUET CUFF) ×1 IMPLANT
DRAPE FLUOR MINI C-ARM 54X84 (DRAPES) ×2 IMPLANT
DURAPREP 26ML APPLICATOR (WOUND CARE) ×2 IMPLANT
ELECT REM PT RETURN 9FT ADLT (ELECTROSURGICAL) ×2
ELECTRODE REM PT RTRN 9FT ADLT (ELECTROSURGICAL) ×1 IMPLANT
GAUZE SPONGE 4X4 12PLY STRL (GAUZE/BANDAGES/DRESSINGS) ×2 IMPLANT
GAUZE XEROFORM 1X8 LF (GAUZE/BANDAGES/DRESSINGS) ×2 IMPLANT
GLOVE SRG 8 PF TXTR STRL LF DI (GLOVE) ×2 IMPLANT
GLOVE SURG ENC MOIS LTX SZ7.5 (GLOVE) ×4 IMPLANT
GLOVE SURG UNDER POLY LF SZ8 (GLOVE) ×4
GOWN STRL REUS W/ TWL LRG LVL3 (GOWN DISPOSABLE) ×2 IMPLANT
GOWN STRL REUS W/TWL LRG LVL3 (GOWN DISPOSABLE) ×4
K-WIRE DBL END TROCAR 6X.045 (WIRE) ×2
K-WIRE DBL END TROCAR 6X.062 (WIRE) ×2
KIT TURNOVER KIT A (KITS) ×2 IMPLANT
KWIRE DBL END TROCAR 6X.045 (WIRE) IMPLANT
KWIRE DBL END TROCAR 6X.062 (WIRE) IMPLANT
NS IRRIG 500ML POUR BTL (IV SOLUTION) ×2 IMPLANT
PACK EXTREMITY ARMC (MISCELLANEOUS) ×2 IMPLANT
RASP SM TEAR CROSS CUT (RASP) ×1 IMPLANT
SCREW SHORTHEAD 2.0MMX24MM (Screw) ×1 IMPLANT
STOCKINETTE IMPERVIOUS LG (DRAPES) ×2 IMPLANT
STRIP CLOSURE SKIN 1/4X4 (GAUZE/BANDAGES/DRESSINGS) ×2 IMPLANT
SUT MNCRL 4-0 (SUTURE) ×2
SUT MNCRL 4-0 27XMFL (SUTURE) ×1
SUT VIC AB 3-0 SH 27 (SUTURE) ×2
SUT VIC AB 3-0 SH 27X BRD (SUTURE) IMPLANT
SUT VIC AB 4-0 SH 27 (SUTURE) ×2
SUT VIC AB 4-0 SH 27XANBCTRL (SUTURE) IMPLANT
SUTURE MNCRL 4-0 27XMF (SUTURE) IMPLANT

## 2022-05-11 NOTE — Anesthesia Procedure Notes (Signed)
Procedure Name: LMA Insertion Date/Time: 05/11/2022 12:10 PM  Performed by: Jimmy Picket, CRNAPre-anesthesia Checklist: Patient identified, Emergency Drugs available, Suction available, Timeout performed and Patient being monitored Patient Re-evaluated:Patient Re-evaluated prior to induction Oxygen Delivery Method: Circle system utilized Preoxygenation: Pre-oxygenation with 100% oxygen Induction Type: IV induction LMA: LMA inserted LMA Size: 4.0 Number of attempts: 1 Placement Confirmation: positive ETCO2 and breath sounds checked- equal and bilateral Tube secured with: Tape

## 2022-05-11 NOTE — Transfer of Care (Signed)
Immediate Anesthesia Transfer of Care Note  Patient: Kyle Fields  Procedure(s) Performed: OSTEOTOMY Eliberto Ivory (MITCHELL) (Right: Foot)  Patient Location: PACU  Anesthesia Type: General  Level of Consciousness: awake, alert  and patient cooperative  Airway and Oxygen Therapy: Patient Spontanous Breathing and Patient connected to supplemental oxygen  Post-op Assessment: Post-op Vital signs reviewed, Patient's Cardiovascular Status Stable, Respiratory Function Stable, Patent Airway and No signs of Nausea or vomiting  Post-op Vital Signs: Reviewed and stable  Complications: No notable events documented.

## 2022-05-11 NOTE — Anesthesia Preprocedure Evaluation (Signed)
Anesthesia Evaluation  Patient identified by MRN, date of birth, ID band Patient awake    Reviewed: Allergy & Precautions, H&P , NPO status , Patient's Chart, lab work & pertinent test results, reviewed documented beta blocker date and time   Airway Mallampati: II  TM Distance: >3 FB Neck ROM: full    Dental no notable dental hx.    Pulmonary neg pulmonary ROS,    Pulmonary exam normal breath sounds clear to auscultation       Cardiovascular Exercise Tolerance: Good hypertension, Normal cardiovascular exam Rhythm:regular Rate:Normal     Neuro/Psych negative neurological ROS  negative psych ROS   GI/Hepatic Neg liver ROS, GERD  ,  Endo/Other  negative endocrine ROS  Renal/GU negative Renal ROS  negative genitourinary   Musculoskeletal   Abdominal   Peds  Hematology negative hematology ROS (+)   Anesthesia Other Findings   Reproductive/Obstetrics negative OB ROS                             Anesthesia Physical Anesthesia Plan  ASA: 2  Anesthesia Plan: General   Post-op Pain Management:    Induction:   PONV Risk Score and Plan:   Airway Management Planned:   Additional Equipment:   Intra-op Plan:   Post-operative Plan:   Informed Consent: I have reviewed the patients History and Physical, chart, labs and discussed the procedure including the risks, benefits and alternatives for the proposed anesthesia with the patient or authorized representative who has indicated his/her understanding and acceptance.     Dental Advisory Given  Plan Discussed with: CRNA and Anesthesiologist  Anesthesia Plan Comments:         Anesthesia Quick Evaluation

## 2022-05-11 NOTE — Op Note (Signed)
Operative note   Surgeon:Elexus Barman Armed forces logistics/support/administrative officer: None    Preop diagnosis: 1.  Hallux valgus deformity right foot    Postop diagnosis:1.  Hallux valgus deformity right foot 2.  Tophaceous gout right first MTPJ    Procedure: 1.  [place] hallux valgus correction right foot 2.  Excision tophaceous gout right first MTPJ 3.  Intraoperative fluoroscopy use    EBL: Minimal    Anesthesia:local and general.  Local consisted of a total of 10 cc of 0.25% bupivacaine plain and 20 cc of Exparel long-acting anesthetic    Hemostasis: Mid calf tourniquet inflated to 200 mmHg for approximately 45 minutes    Specimen: Soft tissue mass consistent with tophaceous gout right first MTPJ    Complications: None    Operative indications:Kyle Fields is an 51 y.o. that presents today for surgical intervention.  The risks/benefits/alternatives/complications have been discussed and consent has been given.    Procedure:  Patient was brought into the OR and placed on the operating table in thesupine position. After anesthesia was obtained theright lower extremity was prepped and draped in usual sterile fashion.  Attention was directed to the dorsomedial right first MTPJ where incision was performed.  Sharp and blunt dissection carried down to the capsule.  At this time within the capsule there was noted to be a soft tissue mass.  It seemed fluctuant in nature and upon excision there was noted to be tophaceous gout.  This was excised from the surgical field in toto and sent for pathological examination.  The capsulotomy was then completed.  The dorsomedial eminence was noted and transected with a power saw.  Next a K wire was used for the apical axis guide.  A V osteotomy was created.  The capital fragment was translocated laterally.  This was temporary stabilized with a 0.062 K wire.  Next a 2.0 mm cannulated headless screw from the [employer] set was used for stabilization from a dorsal distal to plantar proximal.   Good stability was noted and good realignment was noted.  The ensuing overhanging ledge was transected with a power saw and smoothed with a power rasp.  Further flush of the joint was performed.  Small amounts of tophaceous gout were removed throughout the procedure.  The joint itself was inspected the articular cartilage was intact.  At this time layered closure was performed with a 3-0 Vicryl for the capsule.  A small capsulorrhaphy had been performed.  4-0 Vicryl for the subcutaneous tissue and a 4-0 Monocryl for the skin.  A bulky sterile dressing was applied.    Patient tolerated the procedure and anesthesia well.  Was transported from the OR to the PACU with all vital signs stable and vascular status intact. To be discharged per routine protocol.  Will follow up in approximately 1 week in the outpatient clinic.

## 2022-05-11 NOTE — Anesthesia Postprocedure Evaluation (Signed)
Anesthesia Post Note  Patient: Kyle Fields  Procedure(s) Performed: OSTEOTOMY Eliberto Ivory (MITCHELL) (Right: Foot)     Patient location during evaluation: PACU Anesthesia Type: General Level of consciousness: awake and alert Pain management: pain level controlled Vital Signs Assessment: post-procedure vital signs reviewed and stable Respiratory status: spontaneous breathing, nonlabored ventilation, respiratory function stable and patient connected to nasal cannula oxygen Cardiovascular status: blood pressure returned to baseline and stable Postop Assessment: no apparent nausea or vomiting Anesthetic complications: no   No notable events documented.  Alta Corning

## 2022-05-11 NOTE — H&P (Signed)
HISTORY AND PHYSICAL INTERVAL NOTE:  05/11/2022  11:57 AM  Lia Hopping  has presented today for surgery, with the diagnosis of M79.671 - Foot Pain, right M20.11 - Hallux valgus of right foot.  The various methods of treatment have been discussed with the patient.  No guarantees were given.  After consideration of risks, benefits and other options for treatment, the patient has consented to surgery.  I have reviewed the patients' chart and labs.     A history and physical examination was performed in my office.  The patient was reexamined.  There have been no changes to this history and physical examination.  Gwyneth Revels A

## 2022-06-07 LAB — ANATOMIC PATHOLOGY REPORT

## 2022-08-17 ENCOUNTER — Other Ambulatory Visit: Payer: Self-pay | Admitting: Orthopedic Surgery

## 2022-08-17 DIAGNOSIS — M542 Cervicalgia: Secondary | ICD-10-CM

## 2022-08-17 NOTE — Progress Notes (Signed)
Referring Physician:  Myrene Buddy, NP [address] [place],  [place] [address]  Primary Physician:  Myrene Buddy, NP  History of Present Illness:  Procedure: C6-7 arthroplasty Date of procedure: 01/02/2019 Diagnosis: Cervical radiculopathy   08/19/2022 [name] was last seen by Dr. Myer Haff on 03/14/19 and he was to start naproxen and do PT if no better.   He was seen by PCP on 07/28/22 for constant neck and right shoulder pain x 1-2 months. No left arm pain. Has trouble sleeping due to pain. This feels like pain he had prior to above surgery. No numbness/tingling. He notes weakness in right arm. He is right hand dominant.   He has mallet finger right small finger- he is in a splint for 2 more weeks.   Given neurontin by PCP- he thinks this helps a little bit. Takes the edge off.   No improvement with previous cervical ESI- first did not help and second made pain worse (gave him CP).    Conservative measures:  Physical therapy: no recent  Multimodal medical therapy including regular antiinflammatories: neurontin  Injections: No recent epidural steroid injections  Past Surgery: C6-7 arthroplasty on 01/02/2019  Kyle Fields has no symptoms of cervical myelopathy.  The symptoms are causing a significant impact on the patient's life.   Review of Systems:  A 10 point review of systems is negative, except for the pertinent positives and negatives detailed in the HPI.  Past Medical History: Past Medical History:  Diagnosis Date   Anginal pain (HCC) 2016   Normal stress test   Arm pain    neck shoulder and arm pain   DJD (degenerative joint disease)    GERD (gastroesophageal reflux disease)    H/O degenerative disc disease    Hypertension    borderline   Rhinitis    Tinea     Past Surgical History: Past Surgical History:  Procedure Laterality Date   CERVICAL DISC ARTHROPLASTY N/A 01/02/2019   Procedure: C6-7 ARTHROPLASTY;  Surgeon:  Venetia Night, MD;  Location: [facility];  Service: Neurosurgery;  Laterality: N/A;   ESOPHAGOGASTRODUODENOSCOPY (EGD) WITH PROPOFOL N/A 12/01/2017   Procedure: ESOPHAGOGASTRODUODENOSCOPY (EGD) WITH PROPOFOL;  Surgeon: Christena Deem, MD;  Location: [facility];  Service: Endoscopy;  Laterality: N/A;   VASECTOMY     VASECTOMY     WEIL OSTEOTOMY Right 05/11/2022   Procedure: OSTEOTOMY - AUSTIN (MITCHELL);  Surgeon: Gwyneth Revels, DPM;  Location: [facility];  Service: Podiatry;  Laterality: Right;   WISDOM TOOTH EXTRACTION      Allergies: Allergies as of 08/19/2022 - Review Complete 08/19/2022  Allergen Reaction Noted   Bee venom Other (See Comments) 05/21/2016   Other Hives and Swelling 05/04/2022    Medications: Outpatient Encounter Medications as of 08/19/2022  Medication Sig   allopurinol (ZYLOPRIM) 100 MG tablet Take 100 mg by mouth daily.   EPINEPHrine 0.3 mg/0.3 mL IJ SOAJ injection Inject 0.3 mg into the muscle as needed for anaphylaxis.    famotidine (PEPCID) 20 MG tablet Take 20 mg by mouth 2 (two) times daily as needed for heartburn or indigestion.   gabapentin (NEURONTIN) 100 MG capsule Take 100 mg by mouth in the morning, at noon, and at bedtime.   [DISCONTINUED] oxyCODONE-acetaminophen (PERCOCET) 5-325 MG tablet Take 1 tablet by mouth every 4 (four) hours as needed for severe pain.   No facility-administered encounter medications on file as of 08/19/2022.    Social History: Social History   Tobacco  Use   Smoking status: Never   Smokeless tobacco: Never  Vaping Use   Vaping Use: Never used  Substance Use Topics   Alcohol use: Yes    Alcohol/week: 2.0 standard drinks of alcohol    Types: 2 Shots of liquor per week    Comment: occasionally   Drug use: No    Family Medical History: Family History  Problem Relation Age of Onset   Diabetes Mother    Healthy Father     Physical Examination: Vitals:   08/19/22 0901  BP: (!) 133/95  Pulse: 88     General: Patient is well developed, well nourished, calm, collected, and in no apparent distress. Attention to examination is appropriate.  Respiratory: Patient is breathing without any difficulty.   NEUROLOGICAL:     Awake, alert, oriented to person, place, and time.  Speech is clear and fluent. Fund of knowledge is appropriate.   Cranial Nerves: Pupils equal round and reactive to light.  Facial tone is symmetric.  Facial sensation is symmetric.  ROM of spine:  Limited ROM of cervical spine with some pain  Cervical incision well healed.   Mild posterior cervical and right trapezial tenderness.  Right small finger is in a splint- has mallet finger.    Strength: Side Biceps Triceps Deltoid Interossei Grip Wrist Ext. Wrist Flex.  R 5 5 5 5 5 5 5   L 5 5 5 5 5 5 5    Side Iliopsoas Quads Hamstring PF DF EHL  R 5 5 5 5 5 5   L 5 5 5 5 5 5    Reflexes are 2+ and symmetric at the biceps, triceps, brachioradialis, patella and achilles.   Hoffman's is absent.  Clonus is not present.   Bilateral upper and lower extremity sensation is intact to light touch.     He has limited ROM of right shoulder with pain. Has pain with IR/ER of shoulder.   He has pain with stress of rotator cuff on right as well.   Gait is normal.    Medical Decision Making  Imaging: Cervical xrays 08/19/22:  Cervical arthroplasty C6-C7 in good position. Mild cervical spondylosis C5-C6.   I have personally reviewed the images and agree with the above interpretation.  Assessment and Plan: Kyle Fields is a pleasant 51 y.o. male with constant neck and right shoulder pain x 1-2 months. No left arm pain. No numbness/tingling.   Xrays show cervical arthroplasty C6-C7 in good position. Mild cervical spondylosis C5-C6.   Component of pain is likely right shoulder mediated as well- he has limited painful ROM of the shoulder with pain on stress of rotator cuff.   Treatment options discussed with patient and  following plan made:   - MRI of cervical spine to further evaluate right cervical radiculopathy. History of cervical disc arthroplasty C6-C7. No improvement with time or medications.  - PT for cervical spine. Orders to [facility].  - Continue on neurontin from PCP.  - Some of his pain is likely right shoulder mediated. Referral to ortho [facility]) for evaluation.  - Will set up phone visit to review MRI once I ge the results.   I spent a total of 30 minutes in face-to-face and non-face-to-face activities related to this patient's care today.  Thank you for involving me in the care of this patient.   Geronimo Boot PA-C Dept. of Neurosurgery

## 2022-08-19 ENCOUNTER — Encounter: Payer: Self-pay | Admitting: Orthopedic Surgery

## 2022-08-19 ENCOUNTER — Ambulatory Visit
Admission: RE | Admit: 2022-08-19 | Discharge: 2022-08-19 | Disposition: A | Discharge status: Home / Self Care | Payer: BC Managed Care – PPO | Attending: Orthopedic Surgery | Admitting: Orthopedic Surgery

## 2022-08-19 ENCOUNTER — Ambulatory Visit (INDEPENDENT_AMBULATORY_CARE_PROVIDER_SITE_OTHER): Payer: BC Managed Care – PPO | Admitting: Orthopedic Surgery

## 2022-08-19 ENCOUNTER — Ambulatory Visit
Admission: RE | Admit: 2022-08-19 | Discharge: 2022-08-19 | Disposition: A | Discharge status: Home / Self Care | Payer: BC Managed Care – PPO | Source: Ambulatory Visit | Attending: Orthopedic Surgery | Admitting: Orthopedic Surgery

## 2022-08-19 VITALS — BP 133/95 | HR 88 | Ht 71.0 in | Wt 227.6 lb

## 2022-08-19 DIAGNOSIS — M542 Cervicalgia: Secondary | ICD-10-CM | POA: Diagnosis present

## 2022-08-19 DIAGNOSIS — M25511 Pain in right shoulder: Secondary | ICD-10-CM | POA: Diagnosis not present

## 2022-08-19 DIAGNOSIS — M5412 Radiculopathy, cervical region: Secondary | ICD-10-CM

## 2022-08-19 DIAGNOSIS — Z9889 Other specified postprocedural states: Secondary | ICD-10-CM

## 2022-08-19 DIAGNOSIS — M47812 Spondylosis without myelopathy or radiculopathy, cervical region: Secondary | ICD-10-CM

## 2022-08-19 DIAGNOSIS — M4722 Other spondylosis with radiculopathy, cervical region: Secondary | ICD-10-CM

## 2022-08-19 NOTE — Patient Instructions (Signed)
It was so nice to see you today, I am sorry that you are hurting so much.   Your cervical xrays look okay. You have some mild wear and tear above your disc replacement.   I think some of your pain is from your right shoulder. I put in a referral for you to see orthopaedics at the [facility]. They will call you to set this up.   I sent physical therapy orders to [facility], they should call you to schedule.   Okay to continue on gabapentin from your PCP.   I want to get an MRI of your cervical spine to look into things further. We will call you to set this up.   Will set up phone visit to review MRI once it is done.   Please do not hesitate to call if you have any questions or concerns. You can also message me in [place].   If you have not heard back about any of the tests/procedures in the next week, please call the office so we can help you get these things scheduled.   Geronimo Boot PA-C [phone]

## 2022-08-30 DIAGNOSIS — M5412 Radiculopathy, cervical region: Secondary | ICD-10-CM

## 2022-08-30 DIAGNOSIS — M47812 Spondylosis without myelopathy or radiculopathy, cervical region: Secondary | ICD-10-CM

## 2022-08-30 DIAGNOSIS — Z9889 Other specified postprocedural states: Secondary | ICD-10-CM

## 2022-08-30 DIAGNOSIS — M25511 Pain in right shoulder: Secondary | ICD-10-CM

## 2022-10-19 NOTE — Telephone Encounter (Signed)
Can you get his cervical MRI images so I can review them? Looks like it was done at [facility].

## 2022-10-20 ENCOUNTER — Ambulatory Visit
Admission: RE | Admit: 2022-10-20 | Discharge: 2022-10-20 | Disposition: A | Discharge status: Home / Self Care | Payer: Self-pay | Source: Ambulatory Visit | Attending: Orthopedic Surgery | Admitting: Orthopedic Surgery

## 2022-10-20 ENCOUNTER — Other Ambulatory Visit: Payer: Self-pay

## 2022-10-20 DIAGNOSIS — Z049 Encounter for examination and observation for unspecified reason: Secondary | ICD-10-CM

## 2022-10-20 NOTE — Telephone Encounter (Signed)
He confirmed Monday at 8am.

## 2022-10-20 NOTE — Telephone Encounter (Signed)
Please set him up with phone visit to review his cervical MRI. Can do on Monday/Wednesday.

## 2022-10-20 NOTE — Progress Notes (Signed)
Telephone Visit- Progress Note: Referring Physician:  No referring provider defined for this encounter.  Primary Physician:  Myrene Buddy, NP  This visit was performed via telephone.  Patient location: home Provider location: office  I spent a total of 15 minutes non-face-to-face activities for this visit on the date of this encounter including review of current clinical condition and response to treatment.    Patient has given verbal consent to this telephone visits and we reviewed the limitations of a telephone visit. Patient wishes to proceed.      Chief Complaint:  review of cervical MRI scan  History of Present Illness:  Procedure: C6-7 arthroplasty Date of procedure: 01/02/2019 Diagnosis: Cervical radiculopathy    Dion Sibal is a 51 y.o. male was last seen by me on 08/19/22 for neck and right shoulder pain.    He has known mild cervical spondylosis C5-C6.   MRI of cervical spine was ordered at last visit and he was sent to PT (has been going since his last visit). He also was sent to ortho for his right shoulder. Per his ortho note, he was sent to PT for the right shoulder and he was to follow up for right shoulder injection if no improvement.   He continues with constant pain at the base of his neck that radiates into his shoulder blade and into the right shoulder. No pain down the right arm. His pain is worse with moving the right arm. He cannot sleep on right side due to pain.   He has been to PT 8-9 times and they are mostly working on his shoulder, but they are doing some exercises for his neck as well. He stopped the neurontin and is not taking prn OTC motrin.   He is not interested in any further cervical injections.    Conservative measures:  Physical therapy: Has been going to outside PT for shoulder/neck. Has been to 8-9 sessions.  Multimodal medical therapy including regular antiinflammatories: neurontin, motrin.  Injections: No recent  epidural steroid injections   Past Surgery: C6-7 arthroplasty on 01/02/2019   Devean Skoczylas has no symptoms of cervical myelopathy.  Exam: No exam done as this was a telephone encounter.     Imaging: MRI of cervical spine dated 09/22/22:  FINDINGS:  Patient is status post C6-C7 ACDF with susceptibility artifact limiting  evaluation of adjacent structures.  Alignment: Straightening of normal cervical lordosis.  Marrow Signal: no suspicious lesions  Spinal cord: No compression.  Normal in size and signal.   Paraspinal Soft Tissues: Unremarkable.   Occiput-C1: no significant degenerative change  Atlanto-dental interval: normal  C1-2 lateral masses: no significant degenerative change   C2-C3: No significant spinal canal or neuroforaminal narrowing.  C3-C4: Small disc bulge. Mild uncovertebral facet arthropathy. Mild spinal  canal narrowing. Mild to moderate bilateral neuroforaminal narrowing.  C4-C5: Disc bulge and right greater than left uncovertebral arthropathy.  Mild spinal canal narrowing. Severe right and moderate left neuroforaminal  narrowing.  C5-C6: Disc bulge with component of right paracentral/ subarticular  extrusion with narrowing of the right lateral recess. Mild spinal canal  narrowing. Severe right and mild left neuroforaminal narrowing.  C6-C7: Susceptibility artifact limits evaluation.  C7-T1: Susceptibility artifact limits evaluation. No significant spinal  canal or neuroforaminal narrowing within limitation.    IMPRESSION:   1. Status post ACDF of C6-C7.   2. Multilevel degenerative changes of the cervical spine with mild spinal  canal narrowing at C3-C6 and multilevel neuroforaminal narrowing up to  severe at C4-C5 and C5-C6 on the right, similar to the 12/20/2018 MRI.    Electronically Reviewed by:  Tenna Delaine, MD, [facility]  Electronically Reviewed on:  09/23/2022 12:47 PM   I have reviewed the images and concur with the above findings.    Electronically Signed by:  Mal Amabile, MD, [facility]  Electronically Signed on:  09/23/2022 12:49 PM    I have personally reviewed the images and agree with the above interpretation.  Assessment and Plan: Mr. Macha is a pleasant 51 y.o. male with history of C6-C7 arthroplasty 01/02/19.   He continues with constant pain at the base of his neck that radiates into his shoulder blade and into the right shoulder. No pain down the right arm. His pain is worse with moving the right arm. He cannot sleep on right side due to pain.   He has known mild cervical spondylosis C5-C6. 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   Current right shoulder pain appears more shoulder mediated. Pain in neck likely due to underlying degeneration. Has not seen significant relief with PT for shoulder/neck.   Treatment options discussed with patient and following plan made:   - Recommend he follow back up with ortho to consider diagnostic/therapuetic right shoulder injection. Message to Dedra Skeens PA-C to contact him with appointment.  - Continue PT for shoulder/neck.  - Okay to continue on motrin tid prn. Reviewed dosing and side effects. Take with food.  - Discussed possible cervical injections if no improvement with shoulder injection. He is not interested.  - He will follow up with Dr. Myer Haff in 6-8 weeks for recheck. If no improvement with right shoulder injection, may need to discuss if he is candidate for further cervical surgery.   Drake Leach PA-C Neurosurgery

## 2022-10-24 ENCOUNTER — Encounter: Payer: Self-pay | Admitting: Orthopedic Surgery

## 2022-10-24 ENCOUNTER — Ambulatory Visit (INDEPENDENT_AMBULATORY_CARE_PROVIDER_SITE_OTHER): Payer: BC Managed Care – PPO | Admitting: Orthopedic Surgery

## 2022-10-24 DIAGNOSIS — M47812 Spondylosis without myelopathy or radiculopathy, cervical region: Secondary | ICD-10-CM

## 2022-10-24 DIAGNOSIS — M25511 Pain in right shoulder: Secondary | ICD-10-CM

## 2022-10-24 DIAGNOSIS — Z9889 Other specified postprocedural states: Secondary | ICD-10-CM | POA: Diagnosis not present

## 2022-12-06 ENCOUNTER — Ambulatory Visit: Payer: BC Managed Care – PPO | Admitting: Neurosurgery

## 2022-12-06 ENCOUNTER — Other Ambulatory Visit: Payer: Self-pay | Admitting: Orthopedic Surgery

## 2022-12-06 DIAGNOSIS — M7541 Impingement syndrome of right shoulder: Secondary | ICD-10-CM

## 2022-12-06 DIAGNOSIS — G8929 Other chronic pain: Secondary | ICD-10-CM

## 2022-12-15 ENCOUNTER — Ambulatory Visit
Admission: RE | Admit: 2022-12-15 | Discharge: 2022-12-15 | Disposition: A | Discharge status: Home / Self Care | Payer: BC Managed Care – PPO | Source: Ambulatory Visit | Attending: Orthopedic Surgery | Admitting: Orthopedic Surgery

## 2022-12-15 DIAGNOSIS — G8929 Other chronic pain: Secondary | ICD-10-CM

## 2022-12-15 DIAGNOSIS — M7541 Impingement syndrome of right shoulder: Secondary | ICD-10-CM
